# Patient Record
Sex: Female | Born: 2000 | Hispanic: No | Marital: Single | Smoking: Never smoker
Health system: Southern US, Community
[De-identification: ages and names within clinical notes are randomized; demographics above are authoritative.]

## PROBLEM LIST (undated history)

## (undated) DIAGNOSIS — Z789 Other specified health status: Secondary | ICD-10-CM

## (undated) HISTORY — PX: NO PAST SURGERIES: SHX2092

---
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
## 2019-11-21 ENCOUNTER — Other Ambulatory Visit: Payer: Self-pay

## 2019-11-21 ENCOUNTER — Ambulatory Visit (INDEPENDENT_AMBULATORY_CARE_PROVIDER_SITE_OTHER): Payer: Medicaid Other | Admitting: Dermatology

## 2019-11-21 DIAGNOSIS — L700 Acne vulgaris: Secondary | ICD-10-CM

## 2019-11-21 DIAGNOSIS — L910 Hypertrophic scar: Secondary | ICD-10-CM | POA: Diagnosis not present

## 2019-11-21 MED ORDER — DIFFERIN 0.3 % EX GEL: CUTANEOUS | 2 refills | Status: DC

## 2019-11-21 MED ORDER — CLINDAMYCIN PHOSPHATE 1 % EX LOTN: TOPICAL_LOTION | Freq: Every morning | CUTANEOUS | 2 refills | Status: DC

## 2019-11-21 NOTE — Progress Notes (Signed)
   New Patient Visit  Subjective  Kelly Ponce is a 19 y.o. female who presents for the following: Acne.  Patient here today for acne. She has had acne on the face for about 2-3 years. PCP prescribed patient a topical cream but patient does not remember what it was and advises it did not work. Patient also has 2 bumps on upper left arm. Present for about 1 year. They do occasionally itch. Patient has treated with neosporin.   The following portions of the chart were reviewed this encounter and updated as appropriate:     Review of Systems:  No other skin or systemic complaints except as noted in HPI or Assessment and Plan.  Objective  Well appearing patient in no apparent distress; mood and affect are within normal limits.  A focused examination was performed including face, left arm. Relevant physical exam findings are noted in the Assessment and Plan.  Objective  Face: Cheeks with resolving inflammatory papules, hyperpigmented macules. Inflammatory papule on forehead.   Objective  Left Shoulder - Posterior (2): Firm pink/brown papulenodule  x 2   Assessment & Plan  Acne vulgaris Face  Start Differin 0.3% gel to cheeks and forehead QOHS-QHS as tolerated. Use pea size amount and rub in evenly. May apply a light moisturizer first.  Recommend a gentle cleanser.  Start clindamycin lotion QAM.   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 Informed consent was obtained. Preparation: The area was prepared a standard  fashion.  Anesthesia:none  Procedure Details: An intralesional injection was performed with Kenalog 10 mg/cc. 0.4 cc in total were injected.  Total number of injections: < 7  Plan: The patient was instructed on post-op care. Recommend OTC analgesia as needed for pain.   Return in 3 months (on 02/21/2020) for ACNE.   Anise Salvo, RMA, am acting as scribe for Willeen Niece, MD .   Documentation: I have reviewed the above documentation for accuracy and completeness, and I agree with the above.  Willeen Niece, MD

## 2019-11-21 NOTE — Patient Instructions (Signed)
Topical retinoid medications like tretinoin/Retin-A, adapalene/Differin, tazarotene/Fabior, and Epiduo/Epiduo Forte can cause dryness and irritation when first started. Only apply a pea-sized amount to the entire affected area. Avoid applying it around the eyes, edges of mouth and creases at the nose. If you experience irritation, use a good moisturizer first and/or apply the medicine less often. If you are doing well with the medicine, you can increase how often you use it until you are applying every night. Be careful with sun protection while using this medication as it can make you sensitive to the sun. This medicine should not be used by pregnant women.   Recommend gentle cleanser.

## 2019-11-30 ENCOUNTER — Other Ambulatory Visit: Payer: Self-pay

## 2019-11-30 MED ORDER — CLINDAMYCIN PHOSPHATE 1 % EX SOLN: Freq: Every morning | CUTANEOUS | 0 refills | Status: DC

## 2019-11-30 NOTE — Progress Notes (Signed)
Clindamycin Solution sent in as replacement to Clindamycin Lotion due to insurance formulary.

## 2019-12-06 ENCOUNTER — Other Ambulatory Visit: Payer: Self-pay | Admitting: Dermatology

## 2020-03-19 ENCOUNTER — Ambulatory Visit (INDEPENDENT_AMBULATORY_CARE_PROVIDER_SITE_OTHER): Payer: Medicaid Other | Admitting: Dermatology

## 2020-03-19 ENCOUNTER — Other Ambulatory Visit: Payer: Self-pay

## 2020-03-19 DIAGNOSIS — L700 Acne vulgaris: Secondary | ICD-10-CM

## 2020-03-19 DIAGNOSIS — L910 Hypertrophic scar: Secondary | ICD-10-CM | POA: Diagnosis not present

## 2020-03-19 MED ORDER — CLINDAMYCIN PHOSPHATE 1 % EX LOTN: TOPICAL_LOTION | Freq: Every morning | CUTANEOUS | 2 refills | Status: DC

## 2020-03-19 MED ORDER — DIFFERIN 0.3 % EX GEL: CUTANEOUS | 2 refills | Status: DC

## 2020-03-19 NOTE — Patient Instructions (Signed)
Topical retinoid medications like tretinoin/Retin-A, adapalene/Differin, tazarotene/Fabior, and Epiduo/Epiduo Forte can cause dryness and irritation when first started. Only apply a pea-sized amount to the entire affected area. Avoid applying it around the eyes, edges of mouth and creases at the nose. If you experience irritation, use a good moisturizer first and/or apply the medicine less often. If you are doing well with the medicine, you can increase how often you use it until you are applying every night. Be careful with sun protection while using this medication as it can make you sensitive to the sun. This medicine should not be used by pregnant women.   Recommend daily broad spectrum sunscreen SPF 30+ to sun-exposed areas, reapply every 2 hours as needed. Call for new or changing lesions.

## 2020-03-19 NOTE — Progress Notes (Signed)
   Follow-Up Visit   Subjective  Kelly Ponce is a 19 y.o. female who presents for the following: Follow-up.  Patient here today for 3 month acne follow up. She is using Differin 0.3% gel every night and clindamycin lotion in the mornings. She advises her acne has improved. She would also like IL injection to hypertrophic scar at left shoulder. Patient had injection at last visit and it did help flatten out larger scar.  The following portions of the chart were reviewed this encounter and updated as appropriate:      Review of Systems:  No other skin or systemic complaints except as noted in HPI or Assessment and Plan.  Objective  Well appearing patient in no apparent distress; mood and affect are within normal limits.  A focused examination was performed including face, neck, chest and back. Relevant physical exam findings are noted in the Assessment and Plan.  Objective  face: Hyperpigmented macules on cheeks and chin, Inflamed comedone on left cheek  Objective  Left Shoulder: Superior scar with flattening with mild induration superior edge 1.2cm, Firm flesh nodule 60mm inferior   Assessment & Plan  Acne vulgaris face  Improved with PIH  Cont Differin 0.3% gel nightly Cont clindamycin lotion in the mornings Sunscreen qam  DIFFERIN 0.3 % gel - face  Ordered Medications: DIFFERIN 0.3 % gel  Reordered Medications clindamycin (CLEOCIN-T) 1 % lotion  Hypertrophic scar Left Shoulder  Improving with IL steroid injections  Intralesional injection - Left Shoulder Location: left posterior shoulder  Informed Consent: Discussed risks (infection, pain, bleeding, bruising, thinning of the skin, loss of skin pigment, lack of resolution, and recurrence of lesion) and benefits of the procedure, as well as the alternatives. Informed consent was obtained. Preparation: The area was prepared a standard fashion.  Anesthesia:none  Procedure Details: An intralesional injection  was performed with Kenalog 10 mg/cc. 0.2 cc in total were injected.  Total number of injections: 4  Plan: The patient was instructed on post-op care. Recommend OTC analgesia as needed for pain.   Return in about 6 months (around 09/16/2020) for Acne.  Anise Salvo, RMA, am acting as scribe for Willeen Niece, MD . Documentation: I have reviewed the above documentation for accuracy and completeness, and I agree with the above.  Willeen Niece MD

## 2020-09-10 ENCOUNTER — Other Ambulatory Visit: Payer: Self-pay

## 2020-09-10 ENCOUNTER — Ambulatory Visit (INDEPENDENT_AMBULATORY_CARE_PROVIDER_SITE_OTHER): Payer: Medicaid Other | Admitting: Dermatology

## 2020-09-10 DIAGNOSIS — L910 Hypertrophic scar: Secondary | ICD-10-CM | POA: Diagnosis not present

## 2020-09-10 DIAGNOSIS — L700 Acne vulgaris: Secondary | ICD-10-CM | POA: Diagnosis not present

## 2020-09-10 MED ORDER — DIFFERIN 0.3 % EX GEL: CUTANEOUS | 5 refills | Status: DC

## 2020-09-10 MED ORDER — CLINDAMYCIN PHOSPHATE 1 % EX LOTN: TOPICAL_LOTION | Freq: Every morning | CUTANEOUS | 5 refills | Status: AC

## 2020-09-10 NOTE — Progress Notes (Signed)
   Follow-Up Visit   Subjective  Kelly Ponce is a 20 y.o. female who presents for the following: Acne (Patient here today for acne follow up. She is using Differin 0.3% gel at night and clindamycin lotion in the am. Patient advises acne has improved. ).  She has a thickened scar on her shoulder that was treated last visit with ILK.  It improved then gradually grew back.  The following portions of the chart were reviewed this encounter and updated as appropriate:       Review of Systems:  No other skin or systemic complaints except as noted in HPI or Assessment and Plan.  Objective  Well appearing patient in no apparent distress; mood and affect are within normal limits.  A focused examination was performed including face, neck, chest and back. Relevant physical exam findings are noted in the Assessment and Plan.  Objective  face: Few inflamed comedones at right cheek, hyperpigmented macules at forehead and cheeks  Objective  Left Shoulder - Posterior: Firm pink brown plaque 1.5cm   Assessment & Plan  Acne vulgaris face  Controlled Continue Differin 0.3% gel QHS as tolerated Continue clindamycin lotion QAM  Topical retinoid medications like tretinoin/Retin-A, adapalene/Differin, tazarotene/Fabior, and Epiduo/Epiduo Forte can cause dryness and irritation when first started. Only apply a pea-sized amount to the entire affected area. Avoid applying it around the eyes, edges of mouth and creases at the nose. If you experience irritation, use a good moisturizer first and/or apply the medicine less often. If you are doing well with the medicine, you can increase how often you use it until you are applying every night. Be careful with sun protection while using this medication as it can make you sensitive to the sun. This medicine should not be used by pregnant women.    Reordered Medications DIFFERIN 0.3 % gel clindamycin (CLEOCIN-T) 1 % lotion  Other Related  Medications DIFFERIN 0.3 % gel  Hypertrophic scar Left Shoulder - Posterior  Intralesional steroid injection side effects were reviewed including thinning of the skin and discoloration, such as redness, lightening or darkening.     Intralesional injection - Left Shoulder - Posterior Location: left posterior shoulder  Informed Consent: Discussed risks (infection, pain, bleeding, bruising, thinning of the skin, loss of skin pigment, lack of resolution, and recurrence of lesion) and benefits of the procedure, as well as the alternatives. Informed consent was obtained. Preparation: The area was prepared a standard fashion.  Anesthesia:none  Procedure Details: An intralesional injection was performed with Kenalog 10 mg/cc. 0.2 cc in total were injected.  Total number of injections: < 7  Plan: The patient was instructed on post-op care. Recommend OTC analgesia as needed for pain.   Return in about 6 months (around 03/10/2021) for Acne, Hyp.Scar.  Anise Salvo, RMA, am acting as scribe for Willeen Niece, MD . Documentation: I have reviewed the above documentation for accuracy and completeness, and I agree with the above.  Willeen Niece MD

## 2020-09-10 NOTE — Patient Instructions (Signed)

## 2021-03-11 ENCOUNTER — Ambulatory Visit: Payer: Medicaid Other | Admitting: Dermatology

## 2021-04-09 ENCOUNTER — Ambulatory Visit: Payer: Medicaid Other | Admitting: Dermatology

## 2021-07-17 ENCOUNTER — Encounter: Payer: Medicaid Other | Admitting: Obstetrics and Gynecology

## 2021-09-11 ENCOUNTER — Emergency Department: Payer: Medicaid Other

## 2021-09-11 ENCOUNTER — Other Ambulatory Visit: Payer: Self-pay

## 2021-09-11 ENCOUNTER — Observation Stay
Admission: EM | Admit: 2021-09-11 | Discharge: 2021-09-12 | Disposition: A | Discharge status: Home / Self Care | Payer: Medicaid Other | Attending: Internal Medicine | Admitting: Internal Medicine

## 2021-09-11 DIAGNOSIS — R0602 Shortness of breath: Secondary | ICD-10-CM | POA: Diagnosis present

## 2021-09-11 DIAGNOSIS — Z20822 Contact with and (suspected) exposure to covid-19: Secondary | ICD-10-CM | POA: Insufficient documentation

## 2021-09-11 DIAGNOSIS — J45901 Unspecified asthma with (acute) exacerbation: Secondary | ICD-10-CM

## 2021-09-11 DIAGNOSIS — J209 Acute bronchitis, unspecified: Principal | ICD-10-CM | POA: Diagnosis present

## 2021-09-11 DIAGNOSIS — R079 Chest pain, unspecified: Secondary | ICD-10-CM | POA: Diagnosis present

## 2021-09-11 DIAGNOSIS — J40 Bronchitis, not specified as acute or chronic: Secondary | ICD-10-CM

## 2021-09-11 LAB — CBC
HCT: 45.1 % (ref 36.0–46.0)
Hemoglobin: 15.4 g/dL — ABNORMAL HIGH (ref 12.0–15.0)
MCH: 31.3 pg (ref 26.0–34.0)
MCHC: 34.1 g/dL (ref 30.0–36.0)
MCV: 91.7 fL (ref 80.0–100.0)
Platelets: 261 10*3/uL (ref 150–400)
RBC: 4.92 MIL/uL (ref 3.87–5.11)
RDW: 12.7 % (ref 11.5–15.5)
WBC: 10.5 10*3/uL (ref 4.0–10.5)
nRBC: 0 % (ref 0.0–0.2)

## 2021-09-11 LAB — BASIC METABOLIC PANEL
Anion gap: 9 (ref 5–15)
BUN: 10 mg/dL (ref 6–20)
CO2: 25 mmol/L (ref 22–32)
Calcium: 9.4 mg/dL (ref 8.9–10.3)
Chloride: 104 mmol/L (ref 98–111)
Creatinine, Ser: 0.73 mg/dL (ref 0.44–1.00)
GFR, Estimated: >60 mL/min (ref >60)
Glucose, Bld: 88 mg/dL (ref 70–99)
Potassium: 3.7 mmol/L (ref 3.5–5.1)
Sodium: 138 mmol/L (ref 135–145)

## 2021-09-11 LAB — RESP PANEL BY RT-PCR (FLU A&B, COVID) ARPGX2
Influenza A by PCR: NEGATIVE
Influenza B by PCR: NEGATIVE
SARS Coronavirus 2 by RT PCR: NEGATIVE

## 2021-09-11 LAB — TROPONIN I (HIGH SENSITIVITY)
Troponin I (High Sensitivity): <2 ng/L (ref <18)
Troponin I (High Sensitivity): <2 ng/L (ref <18)

## 2021-09-11 LAB — POC URINE PREG, ED: Preg Test, Ur: NEGATIVE

## 2021-09-11 LAB — MAGNESIUM: Magnesium: 1.6 mg/dL — ABNORMAL LOW (ref 1.7–2.4)

## 2021-09-11 MED ORDER — LEVALBUTEROL HCL 1.25 MG/0.5ML IN NEBU
1.2500 mg | INHALATION_SOLUTION | Freq: Four times a day (QID) | RESPIRATORY_TRACT | Status: DC
  Administered 2021-09-12 (×2): 1.25 mg via RESPIRATORY_TRACT
  Filled 2021-09-11 (×3): qty 0.5

## 2021-09-11 MED ORDER — IPRATROPIUM-ALBUTEROL 0.5-2.5 (3) MG/3ML IN SOLN
3.0000 mL | Freq: Once | RESPIRATORY_TRACT | Status: AC
Start: 2021-09-11 — End: 2021-09-11
  Administered 2021-09-11: 3 mL via RESPIRATORY_TRACT
  Filled 2021-09-11: qty 3

## 2021-09-11 MED ORDER — MAGNESIUM SULFATE 2 GM/50ML IV SOLN
2.0000 g | Freq: Once | INTRAVENOUS | Status: AC
Start: 2021-09-11 — End: 2021-09-12
  Administered 2021-09-12: 2 g via INTRAVENOUS
  Filled 2021-09-11: qty 50

## 2021-09-11 MED ORDER — ACETAMINOPHEN 325 MG PO TABS: 650.0000 mg | ORAL_TABLET | Freq: Four times a day (QID) | ORAL | Status: DC | PRN

## 2021-09-11 MED ORDER — ACETAMINOPHEN 650 MG RE SUPP: 650.0000 mg | Freq: Four times a day (QID) | RECTAL | Status: DC | PRN

## 2021-09-11 MED ORDER — IPRATROPIUM-ALBUTEROL 0.5-2.5 (3) MG/3ML IN SOLN
3.0000 mL | Freq: Once | RESPIRATORY_TRACT | Status: AC
  Administered 2021-09-11: 3 mL via RESPIRATORY_TRACT
  Filled 2021-09-11: qty 3

## 2021-09-11 MED ORDER — IPRATROPIUM BROMIDE 0.02 % IN SOLN
0.5000 mg | Freq: Four times a day (QID) | RESPIRATORY_TRACT | Status: DC
  Administered 2021-09-11 – 2021-09-12 (×2): 0.5 mg via RESPIRATORY_TRACT
  Filled 2021-09-11 (×2): qty 2.5

## 2021-09-11 MED ORDER — AZITHROMYCIN 500 MG PO TABS
500.0000 mg | ORAL_TABLET | Freq: Once | ORAL | Status: AC
Start: 2021-09-11 — End: 2021-09-11
  Administered 2021-09-11: 500 mg via ORAL
  Filled 2021-09-11: qty 1

## 2021-09-11 MED ORDER — SODIUM CHLORIDE 0.9 % IV SOLN
500.0000 mg | INTRAVENOUS | Status: DC
  Administered 2021-09-12: 500 mg via INTRAVENOUS
  Filled 2021-09-11: qty 5

## 2021-09-11 MED ORDER — PREDNISONE 20 MG PO TABS
60.0000 mg | ORAL_TABLET | Freq: Once | ORAL | Status: AC
  Administered 2021-09-11: 60 mg via ORAL
  Filled 2021-09-11: qty 3

## 2021-09-11 MED ORDER — PREDNISONE 20 MG PO TABS
40.0000 mg | ORAL_TABLET | Freq: Every day | ORAL | Status: DC
Start: 2021-09-12 — End: 2021-09-12
  Administered 2021-09-12: 40 mg via ORAL
  Filled 2021-09-11: qty 2

## 2021-09-11 MED ORDER — LEVALBUTEROL HCL 1.25 MG/0.5ML IN NEBU
1.2500 mg | INHALATION_SOLUTION | RESPIRATORY_TRACT | Status: DC | PRN
Start: 2021-09-11 — End: 2021-09-12

## 2021-09-11 NOTE — ED Notes (Signed)
20 yof c/o sob and chest pain since Sunday. The pt has constant productive cough with brown sputum. The pt does have some midsternal chest pain especially while coughing. No Resp. Hx.

## 2021-09-11 NOTE — Progress Notes (Signed)
Peak flow best of 5 with good effort 270 mL.

## 2021-09-11 NOTE — ED Provider Notes (Signed)
[facility] Provider Note    Event Date/Time   First MD Initiated Contact with Patient 09/11/21 1735     (approximate)   History   Chest Pain   HPI  Kelly Ponce is a 21 y.o. female who complains of shortness of breath and some pain in her chest when she coughs and brown sputum coming up when she coughs.  She has no fever and some shortness of breath.  COVID test was negative at home.      Physical Exam   Triage Vital Signs: ED Triage Vitals  Enc Vitals Group     BP 09/11/21 1649 117/82     Pulse Rate 09/11/21 1649 98     Resp 09/11/21 1649 20     Temp 09/11/21 1649 99.1 F (37.3 C)     Temp Source 09/11/21 1649 Oral     SpO2 09/11/21 1649 96 %     Weight 09/11/21 1648 140 lb (63.5 kg)     Height 09/11/21 1648 5\' 3"  (1.6 m)     Head Circumference --      Peak Flow --      Pain Score 09/11/21 1648 8     Pain Loc --      Pain Edu? --      Excl. in GC? --     Most recent vital signs: Vitals:   09/11/21 1649 09/11/21 1957  BP: 117/82 106/64  Pulse: 98 (!) 122  Resp: 20 20  Temp: 99.1 F (37.3 C)   SpO2: 96% 95%     General: Awake, no distress.  CV:  Good peripheral perfusion.  Heart regular rate and rhythm no audible murmurs Resp:  Normal effort.  Lungs with some diffuse wheezes Abd:  No distention.  Nontender Extremities: No edema   ED Results / Procedures / Treatments   Labs (all labs ordered are listed, but only abnormal results are displayed) Labs Reviewed  CBC - Abnormal; Notable for the following components:      Result Value   Hemoglobin 15.4 (*)    All other components within normal limits  RESP PANEL BY RT-PCR (FLU A&B, COVID) [id number]  BASIC METABOLIC PANEL  POC URINE PREG, ED  TROPONIN I (HIGH SENSITIVITY)  TROPONIN I (HIGH SENSITIVITY)     EKG  EKG read interpreted by me shows normal sinus rhythm rate of 93 rightward axis slightly 95 degrees.  No acute disease   RADIOLOGY Chest x-ray read by radiology  reviewed by me including the pictures does not show any acute disease   PROCEDURES:  Critical Care performed:   Procedures   MEDICATIONS ORDERED IN ED: Medications  ipratropium-albuterol (DUONEB) 0.5-2.5 (3) MG/3ML nebulizer solution 3 mL (3 mLs Nebulization Given 09/11/21 1751)  ipratropium-albuterol (DUONEB) 0.5-2.5 (3) MG/3ML nebulizer solution 3 mL (3 mLs Nebulization Given 09/11/21 1849)  ipratropium-albuterol (DUONEB) 0.5-2.5 (3) MG/3ML nebulizer solution 3 mL (3 mLs Nebulization Given 09/11/21 2024)  predniSONE (DELTASONE) tablet 60 mg (60 mg Oral Given 09/11/21 2024)  azithromycin (ZITHROMAX) tablet 500 mg (500 mg Oral Given 09/11/21 2024)     IMPRESSION / MDM / ASSESSMENT AND PLAN / ED COURSE  I reviewed the triage vital signs and the nursing notes. ----------------------------------------- 8:14 PM on 09/11/2021 ----------------------------------------- Patient feeling better after 3 DuoNebs and steroids and Zithromax however still with some wheezing and peak flow does not make 300.  Infected just touches to 79 decreases with further attempts.  FINAL CLINICAL IMPRESSION(S) / ED DIAGNOSES   Final diagnoses:  Bronchitis  Reactive airway disease with acute exacerbation, unspecified asthma severity, unspecified whether persistent     Rx / DC Orders   ED Discharge Orders     None        Note:  This document was prepared using Dragon voice recognition software and may include unintentional dictation errors.   Arnaldo Natal, MD 09/11/21 2034

## 2021-09-11 NOTE — ED Triage Notes (Signed)
Patient to ER via POV with complaints of centralized, non-radiating chest pain that started three days ago. Also complaints of cough and congestion. Some shortness of breath. Denies fevers at home. Negative covid test at home.

## 2021-09-12 ENCOUNTER — Encounter: Payer: Self-pay | Admitting: Internal Medicine

## 2021-09-12 DIAGNOSIS — R0602 Shortness of breath: Secondary | ICD-10-CM | POA: Diagnosis present

## 2021-09-12 DIAGNOSIS — J209 Acute bronchitis, unspecified: Secondary | ICD-10-CM | POA: Diagnosis not present

## 2021-09-12 LAB — CBC WITH DIFFERENTIAL/PLATELET
Abs Immature Granulocytes: 0.03 10*3/uL (ref 0.00–0.07)
Basophils Absolute: 0 10*3/uL (ref 0.0–0.1)
Basophils Relative: 0 %
Eosinophils Absolute: 0 10*3/uL (ref 0.0–0.5)
Eosinophils Relative: 0 %
HCT: 40.9 % (ref 36.0–46.0)
Hemoglobin: 13.9 g/dL (ref 12.0–15.0)
Immature Granulocytes: 0 %
Lymphocytes Relative: 5 %
Lymphs Abs: 0.5 10*3/uL — ABNORMAL LOW (ref 0.7–4.0)
MCH: 31.3 pg (ref 26.0–34.0)
MCHC: 34 g/dL (ref 30.0–36.0)
MCV: 92.1 fL (ref 80.0–100.0)
Monocytes Absolute: 0.1 10*3/uL (ref 0.1–1.0)
Monocytes Relative: 1 %
Neutro Abs: 8.1 10*3/uL — ABNORMAL HIGH (ref 1.7–7.7)
Neutrophils Relative %: 94 %
Platelets: 263 10*3/uL (ref 150–400)
RBC: 4.44 MIL/uL (ref 3.87–5.11)
RDW: 12.7 % (ref 11.5–15.5)
WBC: 8.6 10*3/uL (ref 4.0–10.5)
nRBC: 0 % (ref 0.0–0.2)

## 2021-09-12 LAB — COMPREHENSIVE METABOLIC PANEL
ALT: 10 U/L (ref 0–44)
AST: 29 U/L (ref 15–41)
Albumin: 4.1 g/dL (ref 3.5–5.0)
Alkaline Phosphatase: 68 U/L (ref 38–126)
Anion gap: 14 (ref 5–15)
BUN: 11 mg/dL (ref 6–20)
CO2: 18 mmol/L — ABNORMAL LOW (ref 22–32)
Calcium: 9.3 mg/dL (ref 8.9–10.3)
Chloride: 104 mmol/L (ref 98–111)
Creatinine, Ser: 0.72 mg/dL (ref 0.44–1.00)
GFR, Estimated: >60 mL/min (ref >60)
Glucose, Bld: 202 mg/dL — ABNORMAL HIGH (ref 70–99)
Potassium: 3.6 mmol/L (ref 3.5–5.1)
Sodium: 136 mmol/L (ref 135–145)
Total Bilirubin: 0.4 mg/dL (ref 0.3–1.2)
Total Protein: 7.9 g/dL (ref 6.5–8.1)

## 2021-09-12 LAB — HIV ANTIBODY (ROUTINE TESTING W REFLEX): HIV Screen 4th Generation wRfx: NONREACTIVE

## 2021-09-12 LAB — PHOSPHORUS: Phosphorus: 3.2 mg/dL (ref 2.5–4.6)

## 2021-09-12 LAB — PROCALCITONIN: Procalcitonin: <0.1 ng/mL

## 2021-09-12 LAB — MAGNESIUM: Magnesium: 2.3 mg/dL (ref 1.7–2.4)

## 2021-09-12 MED ORDER — PREDNISONE 20 MG PO TABS: 40.0000 mg | ORAL_TABLET | Freq: Every day | ORAL | 0 refills | Status: AC

## 2021-09-12 MED ORDER — LORAZEPAM 0.5 MG PO TABS
0.5000 mg | ORAL_TABLET | Freq: Four times a day (QID) | ORAL | Status: DC | PRN
  Administered 2021-09-12: 0.5 mg via ORAL
  Filled 2021-09-12: qty 1

## 2021-09-12 MED ORDER — ALBUTEROL SULFATE HFA 108 (90 BASE) MCG/ACT IN AERS: 1.0000 | INHALATION_SPRAY | Freq: Four times a day (QID) | RESPIRATORY_TRACT | 0 refills | Status: DC | PRN

## 2021-09-12 MED ORDER — AZITHROMYCIN 500 MG PO TABS: 500.0000 mg | ORAL_TABLET | Freq: Every day | ORAL | 0 refills | Status: AC

## 2021-09-12 NOTE — H&P (Signed)
History and Physical    PLEASE NOTE THAT DRAGON DICTATION SOFTWARE WAS USED IN THE CONSTRUCTION OF THIS NOTE.   Kelly Ponce [id number] DOB: [birth year] DOA: 09/11/2021  PCP: Danelle Berry, NP  Patient coming from: home   I have personally briefly reviewed patient's old medical records in [place]  Chief Complaint: Shortness of breath  HPI: Kelly Ponce is a 21 y.o. female who denies any significant past medical history,  who is admitted to [facility] on 09/11/2021 with suspected acute bronchitis with bronchospasm after presenting from home to [facility] ED complaining of shortness of breath.   The patient reports 2 to 3 days of progressive shortness of breath associated with new onset productive cough associated with brown-colored sputum.  She also notes associated intermittent wheezing as well as rhinitis, rhinorrhea.  While she notes generalized myalgias, she denies any subjective fever, chills, or full body rigors.  No recent headache, neck stiffness, nausea, vomiting, abdominal pain, diarrhea, or rash.  Denies any recent dysuria, gross hematuria, or change in urinary urgency/frequency.  Not associate with any recent chest pain, diaphoresis, or palpitations.  Her shortness of breath is not associate with any orthopnea, PND, or new onset peripheral edema.  No hemoptysis, new lower extremity erythema, or calf tenderness.Denies any recent trauma, travel, surgical procedures, or periods of prolonged diminished ambulatory status. No recent melena or hematochezia.   Denies any known history of underlying chronic pulmonary pathology, including no known history of asthma.  Confirms no baseline supplemental oxygen requirements.     ED Course:  Vital signs in the ED were notable for the following: Temperature max 99.1; heart rate 98-1 09; blood pressure 117/82; respiratory rate 19-20, oxygen saturation 96 to 98% on room air.  Labs were notable for the following: CBC notable for white blood  cell count 10,500, hemoglobin 15.4.  High-sensitivity troponin I x2 values were both less than 2.  COVID-19/influenza PCR negative.  Imaging and additional notable ED work-up: EKG shows sinus rhythm with heart rate 93, normal intervals, no evidence of T wave or ST changes.  Chest x-ray shows no evidence of acute cardiopulmonary process, including no evidence of infiltrate.  While in the ED, the following were administered: Duo nebulizer treatments x2, azithromycin prednisone 60 mg p.o. x1 dose.  Subsequently, the patient was admitted for suspected acute bronchitis with bronchospasm.      Review of Systems: As per HPI otherwise 10 point review of systems negative.   History reviewed. No pertinent past medical history.  History reviewed. No pertinent surgical history.  Social History:  reports that she has never smoked. She has never used smokeless tobacco. She reports that she does not drink alcohol and does not use drugs.   No Known Allergies  History reviewed. No pertinent family history.  Family history reviewed and not pertinent    Prior to Admission medications   Medication Sig Start Date End Date Taking? Authorizing Provider  DIFFERIN 0.3 % gel Apply thin layer to face nightly as directed 03/19/20   Brendolyn Patty, MD  DIFFERIN 0.3 % gel Apply topically to face at bedtime as directed. 09/10/20   Brendolyn Patty, MD  FLUoxetine (PROZAC) 20 MG capsule Take 20 mg by mouth every morning. Patient not taking: Reported on 09/10/2020 02/26/20   [provider]  LO LOESTRIN FE 1 MG-10 MCG / 10 MCG tablet Take 1 tablet by mouth daily. 02/26/20   [provider]     Objective    Physical  Exam: Vitals:   09/11/21 1648 09/11/21 1649 09/11/21 1957 09/12/21 0028  BP:  117/82 106/64 (!) 101/58  Pulse:  98 (!) 122 (!) 116  Resp:  20 20 19   Temp:  99.1 F (37.3 C)    TempSrc:  Oral    SpO2:  96% 95% 98%  Weight: 63.5 kg     Height: 5\' 3"  (1.6 m)       General:  appears to be stated age; alert, oriented Skin: warm, dry, no rash Head:  AT/[place] Mouth:  Oral mucosa membranes appear moist, normal dentition Neck: supple; trachea midline Heart:  RRR; did not appreciate any M/R/G Lungs: Mild bilateral expiratory wheezes, but otherwise CTAB, did not appreciate any rales, or rhonchi Abdomen: + BS; soft, ND, NT Vascular: 2+ pedal pulses b/l; 2+ radial pulses b/l Extremities: no peripheral edema, no muscle wasting Neuro: strength and sensation intact in upper and lower extremities b/l     Labs on Admission: I have personally reviewed following labs and imaging studies  CBC: Recent Labs  Lab 09/11/21 1650  WBC 10.5  HGB 15.4*  HCT 45.1  MCV 91.7  PLT 0000000   Basic Metabolic Panel: Recent Labs  Lab 09/11/21 1650 09/11/21 2054  NA 138  --   K 3.7  --   CL 104  --   CO2 25  --   GLUCOSE 88  --   BUN 10  --   CREATININE 0.73  --   CALCIUM 9.4  --   MG  --  1.6*   GFR: Estimated Creatinine Clearance: 99.7 mL/min (by C-G formula based on SCr of 0.73 mg/dL). Liver Function Tests: No results for input(s): AST, ALT, ALKPHOS, BILITOT, PROT, ALBUMIN in the last 168 hours. No results for input(s): LIPASE, AMYLASE in the last 168 hours. No results for input(s): AMMONIA in the last 168 hours. Coagulation Profile: No results for input(s): INR, PROTIME in the last 168 hours. Cardiac Enzymes: No results for input(s): CKTOTAL, CKMB, CKMBINDEX, TROPONINI in the last 168 hours. BNP (last 3 results) No results for input(s): PROBNP in the last 8760 hours. HbA1C: No results for input(s): HGBA1C in the last 72 hours. CBG: No results for input(s): GLUCAP in the last 168 hours. Lipid Profile: No results for input(s): CHOL, HDL, LDLCALC, TRIG, CHOLHDL, LDLDIRECT in the last 72 hours. Thyroid Function Tests: No results for input(s): TSH, T4TOTAL, FREET4, T3FREE, THYROIDAB in the last 72 hours. Anemia Panel: No results for input(s): VITAMINB12, FOLATE,  FERRITIN, TIBC, IRON, RETICCTPCT in the last 72 hours. Urine analysis:    Component Value Date/Time   COLORURINE YELLOW 06/17/2016 2128   APPEARANCEUR HAZY (A) 06/17/2016 2128   LABSPEC 1.020 06/17/2016 2128   PHURINE 6.0 06/17/2016 2128   GLUCOSEU NEGATIVE 06/17/2016 2128   HGBUR NEGATIVE 06/17/2016 2128   BILIRUBINUR NEGATIVE 06/17/2016 2128   KETONESUR NEGATIVE 06/17/2016 2128   PROTEINUR NEGATIVE 06/17/2016 2128   NITRITE NEGATIVE 06/17/2016 2128   LEUKOCYTESUR NEGATIVE 06/17/2016 2128    Radiological Exams on Admission: DG Chest 2 View  Result Date: 09/11/2021 CLINICAL DATA:  Chest pain, centralized nonradiating EXAM: CHEST - 2 VIEW COMPARISON:  None. FINDINGS: The heart size and mediastinal contours are within normal limits. No focal consolidation. No pleural effusion. No pneumothorax. The visualized skeletal structures are unremarkable. IMPRESSION: No acute cardiopulmonary findings. Electronically Signed   By: Dahlia Bailiff M.D.   On: 09/11/2021 17:08     EKG: Independently reviewed, with result as described above.  Assessment/Plan    Principal Problem:   Acute bronchitis with bronchospasm Active Problems:   Hypomagnesemia   SOB (shortness of breath)    #) Acute bronchitis with bronchospasm: Diagnosis on the basis of 2 to 3 days of progressive shortness of breath, new onset cough, suspected to be viral in nature in the setting of preceding rhinitis/rhinorrhea, as well as associated bronchospasm as evidenced by the presence of wheezing, with chest x-ray showing no evidence of acute cardiopulmonary process, including no evidence of infiltrate.  Will further assess for underlying bacterial pneumonia, which is felt to be less likely at this time, but check procalcitonin.  No underlying history of chronic pulmonary pathology, including no known history of asthma.  Maintaining oxygen saturations in the mid to high 90s on room air.  In the context of mild tachycardia, will  pursue Xopenex as beta-2 agonist of choice at this time.  Of note, COVID-19/influenza PCR negative.  Plan: Xopenex/ipratropium nebulizer treatments every 6 hours.  As needed Xopenex nebulizers.  Prednisone 40 mg p.o. daily.  Azithromycin.  Further evaluation management of presenting hypomagnesemia, as further detailed below.  Add on procalcitonin level.  Repeat CBC with differential in the morning.  Flutter valve, NSAIDs [place].       #) Hypomagnesemia: Presenting serum magnesium level found to be 1.6.  Will provide supplemental IV magnesium, as quantified below.   Plan: Magnesium sulfate 2 g IV every 2 hours x1 dose now.  Repeat serum magnesium level in the morning.      DVT prophylaxis: SCD's   Code Status: Full code Family Communication: none Disposition Plan: Per Rounding Team Consults called: none;  Admission status: Inpatient; med telemetry   PLEASE NOTE THAT DRAGON DICTATION SOFTWARE WAS USED IN THE CONSTRUCTION OF THIS NOTE.   Taylor DO [facility] [place]   09/12/2021, 3:28 AM

## 2021-09-12 NOTE — Discharge Summary (Signed)
Physician Discharge Summary  Kelly Ponce [id number] DOB: 2001-07-20 DOA: 09/11/2021  PCP: Danelle Berry, NP  Admit date: 09/11/2021 Discharge date: 09/12/2021  Admitted From: Home  Disposition: Home   Recommendations for Outpatient Follow-up:  Follow up with PCP in 1-2 weeks Started on azithromycin and prednisone for 5-day course for bronchitis Albuterol MDI as needed  Home Health: No Equipment/Devices: None  Discharge Condition: Stable CODE STATUS: Full code Diet recommendation: Regular diet  History of present illness:  Kelly Ponce is a 21 year old female with no significant past medical history who presented to [facility] ED on 2/23 with 2-3 days of progressive shortness of breath.  Patient also reports productive cough with brown-colored sputum, wheezing, rhinitis, rhinorrhea, generalized myalgias.  Denies sick contacts, no recent medication use.  Denies headache, no neck stiffness, no nausea/vomiting/diarrhea, no abdominal pain, no rash, no dysuria, no hematuria, no chest pain, no diaphoresis, no palpitations, no peripheral edema.  In the ED, temperature 99.1 F, HR 98, RR 20, BP 117/82, SPO2 96% on room air.  WBC 10.5, hemoglobin 15.4, platelets 261.  Sodium 138, potassium 3.7, chloride 104, CO2 25, glucose 88, BUN 10, creatinine 0.73.  High sensitive troponin less than 2.  Procalcitonin less than 0.10.  COVID-19 PCR negative peer influenza A/B PCR negative.  Chest x-ray with no acute cardiopulmonary disease process.  Patient was given DuoNeb x2, started on azithromycin and prednisone.  Hospitalist service consulted for further management of acute bronchitis with bronchospasm.     Hospital course:  Acute bronchitis, POA Patient presenting with progressive shortness of breath, productive cough over the last 2-3 days.  Patient is afebrile without leukocytosis, chest x-ray with no acute findings.  Etiology likely secondary to acute bronchitis.  Patient is not hypoxic.   Patient was started on antibiotics with azithromycin we will continue to complete a 5-day course, will also continue prednisone 40 mg p.o. daily to complete 5-day course.  Albuterol MDI as needed.  Outpatient follow-up with PCP.  Hypomagnesemia Magnesium 1.6 on admission, repleted.  Repeat magnesium 2.3.   Assessment and Plan: Discharge Diagnoses:  Principal Problem:   Acute bronchitis with bronchospasm Active Problems:   Hypomagnesemia   SOB (shortness of breath)    Discharge Instructions  Discharge Instructions     Call MD for:  difficulty breathing, headache or visual disturbances   Complete by: As directed    Call MD for:  extreme fatigue   Complete by: As directed    Call MD for:  persistant dizziness or light-headedness   Complete by: As directed    Call MD for:  persistant nausea and vomiting   Complete by: As directed    Call MD for:  severe uncontrolled pain   Complete by: As directed    Call MD for:  temperature >100.4   Complete by: As directed    Diet - low sodium heart healthy   Complete by: As directed    Increase activity slowly   Complete by: As directed       Allergies as of 09/12/2021   No Known Allergies      Medication List     STOP taking these medications    FLUoxetine 20 MG capsule Commonly known as: PROZAC       TAKE these medications    albuterol 108 (90 Base) MCG/ACT inhaler Commonly known as: Ventolin HFA Inhale 1-2 puffs into the lungs every 6 (six) hours as needed for wheezing or shortness of breath.   azithromycin 500  MG tablet Commonly known as: Zithromax Take 1 tablet (500 mg total) by mouth daily for 4 days. Take 1 tablet daily for 3 days. Start taking on: September 13, 2021   Differin 0.3 % gel Generic drug: Adapalene Apply thin layer to face nightly as directed   Differin 0.3 % gel Generic drug: Adapalene Apply topically to face at bedtime as directed.   Lo Loestrin Fe 1 MG-10 MCG / 10 MCG tablet Generic drug:  Norethindrone-Ethinyl Estradiol-Fe Biphas Take 1 tablet by mouth daily.   predniSONE 20 MG tablet Commonly known as: DELTASONE Take 2 tablets (40 mg total) by mouth daily with breakfast for 4 days. Start taking on: September 13, 2021        Follow-up Information     Danelle Berry, NP. Schedule an appointment as soon as possible for a visit in 1 week(s).   Specialty: Nurse Practitioner Contact information: [place] [address] [phone]                No Known Allergies  Consultations: None   Procedures/Studies: DG Chest 2 View  Result Date: 09/11/2021 CLINICAL DATA:  Chest pain, centralized nonradiating EXAM: CHEST - 2 VIEW COMPARISON:  None. FINDINGS: The heart size and mediastinal contours are within normal limits. No focal consolidation. No pleural effusion. No pneumothorax. The visualized skeletal structures are unremarkable. IMPRESSION: No acute cardiopulmonary findings. Electronically Signed   By: Dahlia Bailiff M.D.   On: 09/11/2021 17:08     Subjective: Patient seen examined bedside, resting comfortably.  Family present at bedside.  No complaints this morning and breathing much improved.  Patient states ready for discharge home.  No other questions or concerns at this time.  Denies headache, no dizziness, no chest pain, no palpitations, no shortness of breath this morning, no abdominal pain, no fever/chills/night sweats, no nausea/vomiting/diarrhea, no weakness, no fatigue, no paresthesias.  No acute events overnight per nursing staff.  Discharge Exam: Vitals:   09/12/21 0630 09/12/21 0830  BP: 96/65 106/69  Pulse: (!) 109 (!) 109  Resp: 17 (!) 21  Temp:    SpO2: 98% 97%   Vitals:   09/12/21 0028 09/12/21 0330 09/12/21 0630 09/12/21 0830  BP: (!) 101/58 94/64 96/65  106/69  Pulse: (!) 116 (!) 103 (!) 109 (!) 109  Resp: 19 18 17  (!) 21  Temp:      TempSrc:      SpO2: 98% 97% 98% 97%  Weight:      Height:        Physical  Exam: GEN: NAD, alert and oriented x 3, wd/wn HEENT: NCAT, PERRL, EOMI, sclera clear, MMM PULM: CTAB w/o wheezes/crackles, normal respiratory effort, on room air CV: RRR w/o M/G/R GI: abd soft, NTND, NABS, no R/G/M MSK: no peripheral edema, muscle strength globally intact 5/5 bilateral upper/lower extremities NEURO: CN II-XII intact, no focal deficits, sensation to light touch intact PSYCH: normal mood/affect Integumentary: dry/intact, no rashes or wounds    The results of significant diagnostics from this hospitalization (including imaging, microbiology, ancillary and laboratory) are listed below for reference.     Microbiology: Recent Results (from the past 240 hour(s))  Resp Panel by RT-PCR (Flu A&B, Covid) Nasopharyngeal Swab     Status: None   Collection Time: 09/11/21  5:52 PM   Specimen: Nasopharyngeal Swab; Nasopharyngeal(NP) swabs in vial transport medium  Result Value Ref Range Status   SARS Coronavirus 2 by RT PCR NEGATIVE NEGATIVE Final    Comment: (NOTE) SARS-CoV-2  target nucleic acids are NOT DETECTED.  The SARS-CoV-2 RNA is generally detectable in upper respiratory specimens during the acute phase of infection. The lowest concentration of SARS-CoV-2 viral copies this assay can detect is 138 copies/mL. A negative result does not preclude SARS-Cov-2 infection and should not be used as the sole basis for treatment or other patient management decisions. A negative result may occur with  improper specimen collection/handling, submission of specimen other than nasopharyngeal swab, presence of viral mutation(s) within the areas targeted by this assay, and inadequate number of viral copies(<138 copies/mL). A negative result must be combined with clinical observations, patient history, and epidemiological information. The expected result is Negative.  Fact Sheet for Patients:  EntrepreneurPulse.com.au  Fact Sheet for Healthcare Providers:   IncredibleEmployment.be  This test is no t yet approved or cleared by the [place] FDA and  has been authorized for detection and/or diagnosis of SARS-CoV-2 by FDA under an Emergency Use Authorization (EUA). This EUA will remain  in effect (meaning this test can be used) for the duration of the COVID-19 declaration under Section 564(b)(1) of the Act, 21 U.S.C.section 360bbb-3(b)(1), unless the authorization is terminated  or revoked sooner.       Influenza A by PCR NEGATIVE NEGATIVE Final   Influenza B by PCR NEGATIVE NEGATIVE Final    Comment: (NOTE) The Xpert Xpress SARS-CoV-2/FLU/RSV plus assay is intended as an aid in the diagnosis of influenza from Nasopharyngeal swab specimens and should not be used as a sole basis for treatment. Nasal washings and aspirates are unacceptable for Xpert Xpress SARS-CoV-2/FLU/RSV testing.  Fact Sheet for Patients: EntrepreneurPulse.com.au  Fact Sheet for Healthcare Providers: IncredibleEmployment.be  This test is not yet approved or cleared by the [place] FDA and has been authorized for detection and/or diagnosis of SARS-CoV-2 by FDA under an Emergency Use Authorization (EUA). This EUA will remain in effect (meaning this test can be used) for the duration of the COVID-19 declaration under Section 564(b)(1) of the Act, 21 U.S.C. section 360bbb-3(b)(1), unless the authorization is terminated or revoked.  Performed at [facility], [address] [place], [place] [address]      Labs: BNP (last 3 results) No results for input(s): BNP in the last 8760 hours. Basic Metabolic Panel: Recent Labs  Lab 09/11/21 1650 09/11/21 2054 09/12/21 0503  NA 138  --  136  K 3.7  --  3.6  CL 104  --  104  CO2 25  --  18*  GLUCOSE 88  --  202*  BUN 10  --  11  CREATININE 0.73  --  0.72  CALCIUM 9.4  --  9.3  MG  --  1.6* 2.3  PHOS  --   --  3.2   Liver Function  Tests: Recent Labs  Lab 09/12/21 0503  AST 29  ALT 10  ALKPHOS 68  BILITOT 0.4  PROT 7.9  ALBUMIN 4.1   No results for input(s): LIPASE, AMYLASE in the last 168 hours. No results for input(s): AMMONIA in the last 168 hours. CBC: Recent Labs  Lab 09/11/21 1650 09/12/21 0503  WBC 10.5 8.6  NEUTROABS  --  8.1*  HGB 15.4* 13.9  HCT 45.1 40.9  MCV 91.7 92.1  PLT 261 263   Cardiac Enzymes: No results for input(s): CKTOTAL, CKMB, CKMBINDEX, TROPONINI in the last 168 hours. BNP: Invalid input(s): POCBNP CBG: No results for input(s): GLUCAP in the last 168 hours. D-Dimer No results for input(s): DDIMER in the last  72 hours. Hgb A1c No results for input(s): HGBA1C in the last 72 hours. Lipid Profile No results for input(s): CHOL, HDL, LDLCALC, TRIG, CHOLHDL, LDLDIRECT in the last 72 hours. Thyroid function studies No results for input(s): TSH, T4TOTAL, T3FREE, THYROIDAB in the last 72 hours.  Invalid input(s): FREET3 Anemia work up No results for input(s): VITAMINB12, FOLATE, FERRITIN, TIBC, IRON, RETICCTPCT in the last 72 hours. Urinalysis    Component Value Date/Time   COLORURINE YELLOW 06/17/2016 2128   APPEARANCEUR HAZY (A) 06/17/2016 2128   LABSPEC 1.020 06/17/2016 2128   PHURINE 6.0 06/17/2016 2128   GLUCOSEU NEGATIVE 06/17/2016 2128   HGBUR NEGATIVE 06/17/2016 2128   BILIRUBINUR NEGATIVE 06/17/2016 2128   KETONESUR NEGATIVE 06/17/2016 2128   PROTEINUR NEGATIVE 06/17/2016 2128   NITRITE NEGATIVE 06/17/2016 2128   LEUKOCYTESUR NEGATIVE 06/17/2016 2128   Sepsis Labs Invalid input(s): PROCALCITONIN,  WBC,  LACTICIDVEN Microbiology Recent Results (from the past 240 hour(s))  Resp Panel by RT-PCR (Flu A&B, Covid) Nasopharyngeal Swab     Status: None   Collection Time: 09/11/21  5:52 PM   Specimen: Nasopharyngeal Swab; Nasopharyngeal(NP) swabs in vial transport medium  Result Value Ref Range Status   SARS Coronavirus 2 by RT PCR NEGATIVE NEGATIVE Final     Comment: (NOTE) SARS-CoV-2 target nucleic acids are NOT DETECTED.  The SARS-CoV-2 RNA is generally detectable in upper respiratory specimens during the acute phase of infection. The lowest concentration of SARS-CoV-2 viral copies this assay can detect is 138 copies/mL. A negative result does not preclude SARS-Cov-2 infection and should not be used as the sole basis for treatment or other patient management decisions. A negative result may occur with  improper specimen collection/handling, submission of specimen other than nasopharyngeal swab, presence of viral mutation(s) within the areas targeted by this assay, and inadequate number of viral copies(<138 copies/mL). A negative result must be combined with clinical observations, patient history, and epidemiological information. The expected result is Negative.  Fact Sheet for Patients:  EntrepreneurPulse.com.au  Fact Sheet for Healthcare Providers:  IncredibleEmployment.be  This test is no t yet approved or cleared by the [place] FDA and  has been authorized for detection and/or diagnosis of SARS-CoV-2 by FDA under an Emergency Use Authorization (EUA). This EUA will remain  in effect (meaning this test can be used) for the duration of the COVID-19 declaration under Section 564(b)(1) of the Act, 21 U.S.C.section 360bbb-3(b)(1), unless the authorization is terminated  or revoked sooner.       Influenza A by PCR NEGATIVE NEGATIVE Final   Influenza B by PCR NEGATIVE NEGATIVE Final    Comment: (NOTE) The Xpert Xpress SARS-CoV-2/FLU/RSV plus assay is intended as an aid in the diagnosis of influenza from Nasopharyngeal swab specimens and should not be used as a sole basis for treatment. Nasal washings and aspirates are unacceptable for Xpert Xpress SARS-CoV-2/FLU/RSV testing.  Fact Sheet for Patients: EntrepreneurPulse.com.au  Fact Sheet for Healthcare  Providers: IncredibleEmployment.be  This test is not yet approved or cleared by the [place] FDA and has been authorized for detection and/or diagnosis of SARS-CoV-2 by FDA under an Emergency Use Authorization (EUA). This EUA will remain in effect (meaning this test can be used) for the duration of the COVID-19 declaration under Section 564(b)(1) of the Act, 21 U.S.C. section 360bbb-3(b)(1), unless the authorization is terminated or revoked.  Performed at [facility], [address] [place], [place] [address]      Time coordinating discharge: Over 30 minutes  SIGNED:  Rollyn Scialdone J [place] DO  [facility] 09/12/2021, 10:47 AM

## 2021-12-08 ENCOUNTER — Emergency Department (EMERGENCY_DEPARTMENT_HOSPITAL)
Admission: EM | Admit: 2021-12-08 | Discharge: 2021-12-08 | Disposition: A | Discharge status: Other Acute Inpatient Hospital | Payer: Medicaid Other | Source: Home / Self Care | Attending: Emergency Medicine | Admitting: Emergency Medicine

## 2021-12-08 ENCOUNTER — Inpatient Hospital Stay
Admission: AD | Admit: 2021-12-08 | Discharge: 2021-12-10 | DRG: 885 | Disposition: A | Discharge status: Home / Self Care | Payer: Medicaid Other | Source: Intra-hospital | Attending: Psychiatry | Admitting: Psychiatry

## 2021-12-08 ENCOUNTER — Encounter: Payer: Self-pay | Admitting: Psychiatry

## 2021-12-08 ENCOUNTER — Other Ambulatory Visit: Payer: Self-pay

## 2021-12-08 DIAGNOSIS — Z20822 Contact with and (suspected) exposure to covid-19: Secondary | ICD-10-CM | POA: Insufficient documentation

## 2021-12-08 DIAGNOSIS — Z79899 Other long term (current) drug therapy: Secondary | ICD-10-CM

## 2021-12-08 DIAGNOSIS — Z9151 Personal history of suicidal behavior: Secondary | ICD-10-CM | POA: Diagnosis not present

## 2021-12-08 DIAGNOSIS — R45851 Suicidal ideations: Secondary | ICD-10-CM | POA: Insufficient documentation

## 2021-12-08 DIAGNOSIS — F1290 Cannabis use, unspecified, uncomplicated: Secondary | ICD-10-CM | POA: Diagnosis present

## 2021-12-08 DIAGNOSIS — F419 Anxiety disorder, unspecified: Secondary | ICD-10-CM | POA: Diagnosis present

## 2021-12-08 DIAGNOSIS — F332 Major depressive disorder, recurrent severe without psychotic features: Secondary | ICD-10-CM | POA: Insufficient documentation

## 2021-12-08 DIAGNOSIS — F32A Depression, unspecified: Secondary | ICD-10-CM

## 2021-12-08 LAB — URINE DRUG SCREEN, QUALITATIVE (ARMC ONLY)
Amphetamines, Ur Screen: NOT DETECTED
Barbiturates, Ur Screen: NOT DETECTED
Benzodiazepine, Ur Scrn: NOT DETECTED
Cannabinoid 50 Ng, Ur ~~LOC~~: POSITIVE — AB
Cocaine Metabolite,Ur ~~LOC~~: NOT DETECTED
MDMA (Ecstasy)Ur Screen: NOT DETECTED
Methadone Scn, Ur: NOT DETECTED
Opiate, Ur Screen: NOT DETECTED
Phencyclidine (PCP) Ur S: NOT DETECTED
Tricyclic, Ur Screen: NOT DETECTED

## 2021-12-08 LAB — COMPREHENSIVE METABOLIC PANEL
ALT: 12 U/L (ref 0–44)
AST: 15 U/L (ref 15–41)
Albumin: 4.7 g/dL (ref 3.5–5.0)
Alkaline Phosphatase: 70 U/L (ref 38–126)
Anion gap: 7 (ref 5–15)
BUN: 12 mg/dL (ref 6–20)
CO2: 25 mmol/L (ref 22–32)
Calcium: 9.4 mg/dL (ref 8.9–10.3)
Chloride: 105 mmol/L (ref 98–111)
Creatinine, Ser: 0.76 mg/dL (ref 0.44–1.00)
GFR, Estimated: >60 mL/min (ref >60)
Glucose, Bld: 99 mg/dL (ref 70–99)
Potassium: 4 mmol/L (ref 3.5–5.1)
Sodium: 137 mmol/L (ref 135–145)
Total Bilirubin: 0.9 mg/dL (ref 0.3–1.2)
Total Protein: 8.2 g/dL — ABNORMAL HIGH (ref 6.5–8.1)

## 2021-12-08 LAB — CBC
HCT: 45 % (ref 36.0–46.0)
Hemoglobin: 15 g/dL (ref 12.0–15.0)
MCH: 30.7 pg (ref 26.0–34.0)
MCHC: 33.3 g/dL (ref 30.0–36.0)
MCV: 92.2 fL (ref 80.0–100.0)
Platelets: 256 10*3/uL (ref 150–400)
RBC: 4.88 MIL/uL (ref 3.87–5.11)
RDW: 12 % (ref 11.5–15.5)
WBC: 8.1 10*3/uL (ref 4.0–10.5)
nRBC: 0 % (ref 0.0–0.2)

## 2021-12-08 LAB — SALICYLATE LEVEL: Salicylate Lvl: <7 mg/dL — ABNORMAL LOW (ref 7.0–30.0)

## 2021-12-08 LAB — RESP PANEL BY RT-PCR (FLU A&B, COVID) ARPGX2
Influenza A by PCR: NEGATIVE
Influenza B by PCR: NEGATIVE
SARS Coronavirus 2 by RT PCR: NEGATIVE

## 2021-12-08 LAB — ACETAMINOPHEN LEVEL: Acetaminophen (Tylenol), Serum: <10 ug/mL — ABNORMAL LOW (ref 10–30)

## 2021-12-08 LAB — POC URINE PREG, ED: Preg Test, Ur: NEGATIVE

## 2021-12-08 LAB — ETHANOL: Alcohol, Ethyl (B): <10 mg/dL (ref <10)

## 2021-12-08 MED ORDER — NICOTINE 14 MG/24HR TD PT24
14.0000 mg | MEDICATED_PATCH | Freq: Every day | TRANSDERMAL | Status: DC
Start: 2021-12-09 — End: 2021-12-10
  Filled 2021-12-08 (×2): qty 1

## 2021-12-08 MED ORDER — ALUM & MAG HYDROXIDE-SIMETH 200-200-20 MG/5ML PO SUSP: 30.0000 mL | ORAL | Status: DC | PRN

## 2021-12-08 MED ORDER — NORETHIN-ETH ESTRAD-FE BIPHAS 1 MG-10 MCG / 10 MCG PO TABS: 1.0000 | ORAL_TABLET | Freq: Every day | ORAL | Status: DC

## 2021-12-08 MED ORDER — MAGNESIUM HYDROXIDE 400 MG/5ML PO SUSP
30.0000 mL | Freq: Every day | ORAL | Status: DC | PRN
Start: 2021-12-08 — End: 2021-12-10

## 2021-12-08 MED ORDER — ACETAMINOPHEN 325 MG PO TABS
650.0000 mg | ORAL_TABLET | Freq: Four times a day (QID) | ORAL | Status: DC | PRN
Start: 2021-12-08 — End: 2021-12-10

## 2021-12-08 MED ORDER — HYDROXYZINE HCL 25 MG PO TABS
25.0000 mg | ORAL_TABLET | Freq: Every day | ORAL | Status: DC
  Administered 2021-12-08 – 2021-12-09 (×2): 25 mg via ORAL
  Filled 2021-12-08 (×2): qty 1

## 2021-12-08 NOTE — ED Notes (Signed)
Kelly Ponce

## 2021-12-08 NOTE — ED Triage Notes (Signed)
Pt here with SI. Pt called her mother and states "She did not want to be here anymore" and that she wanted to cut herself. When asked if she had any contributing factors, pt states "everything: Pt crying in triage.

## 2021-12-08 NOTE — ED Notes (Addendum)
Pt belongings:  1 pair of multi-colored Crocs 1 pair of blue and white leggings 1 pair of gray socks 1 gray tank top 1 pair of yellow and white underwear 1 gray sports bra 1 gray hair tie

## 2021-12-08 NOTE — ED Notes (Signed)
Pt given breakfast tray at this time. 

## 2021-12-08 NOTE — Consult Note (Signed)
[place] [facility] Consult   Reason for Consult:  suicidal ideations Referring Physician:  EDP Patient Identification: Kelly Ponce MRN:  [id number] Principal Diagnosis: Major depressive disorder, recurrent severe without psychotic features ([place]) Diagnosis:  Principal Problem:   Major depressive disorder, recurrent severe without psychotic features ([place])   Total Time spent with patient: 45 minutes  Subjective:   Kelly Ponce is a 21 y.o. female patient admitted with suicidal ideations, plan to overdose.  HPI:  21 yo female with depression and suicidal ideations.  She has a plan to overdose, overwhelmed with stressors and no support system.  High depression with one attempt last fall.  History of cutting to "feel something", last episode in the fall.  High anxiety, no panic attacks.  She drinks alcohol every other weekend of a bottle of liquor over the two days.  No psychosis or paranoia.  Poor sleep and appetite, anhedonia.  Endorses feelings of worthlessness, helplessness, and hopelessness.  Agreeable to admit inpatient to stabilize.  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  Past Psychiatric History: depression, anxiety  Risk to Self:  yes Risk to Others:  none Prior Inpatient Therapy:  none Prior Outpatient Therapy:  none  Past Medical History: History reviewed. No pertinent past medical history. History reviewed. No pertinent surgical history. Family History: History reviewed. No pertinent family history. Family Psychiatric  History: none Social History:  Social History   Substance and Sexual Activity  Alcohol Use No     Social History   Substance and Sexual Activity  Drug Use No    Social History   Socioeconomic History   Marital status: Single    Spouse name: Not on file   Number of children: Not on file   Years of education: Not on file   Highest education level: Not on file  Occupational History   Not on file   Tobacco Use   Smoking status: Never   Smokeless tobacco: Never  Vaping Use   Vaping Use: Never used  Substance and Sexual Activity   Alcohol use: No   Drug use: No   Sexual activity: Not on file  Other Topics Concern   Not on file  Social History Narrative   Not on file   Social Determinants of Health   Financial Resource Strain: Not on file  Food Insecurity: Not on file  Transportation Needs: Not on file  Physical Activity: Not on file  Stress: Not on file  Social Connections: Not on file   Additional Social History:    Allergies:  No Known Allergies  Labs:  Results for orders placed or performed during the hospital encounter of 12/08/21 (from the past 48 hour(s))  Comprehensive metabolic panel     Status: Abnormal   Collection Time: 12/08/21  9:05 AM  Result Value Ref Range   Sodium 137 135 - 145 mmol/L   Potassium 4.0 3.5 - 5.1 mmol/L   Chloride 105 98 - 111 mmol/L   CO2 25 22 - 32 mmol/L   Glucose, Bld 99 70 - 99 mg/dL    Comment: Glucose reference range applies only to samples taken after fasting for at least 8 hours.   BUN 12 6 - 20 mg/dL   Creatinine, Ser 0.76 0.44 - 1.00 mg/dL   Calcium 9.4 8.9 - 10.3 mg/dL   Total Protein 8.2 (H) 6.5 - 8.1 g/dL   Albumin 4.7 3.5 - 5.0 g/dL   AST 15 15 -  41 U/L   ALT 12 0 - 44 U/L   Alkaline Phosphatase 70 38 - 126 U/L   Total Bilirubin 0.9 0.3 - 1.2 mg/dL   GFR, Estimated >60 >60 mL/min    Comment: (NOTE) Calculated using the CKD-EPI Creatinine Equation (2021)    Anion gap 7 5 - 15    Comment: Performed at [facility], [address] [place], [place] [address]  Ethanol     Status: None   Collection Time: 12/08/21  9:05 AM  Result Value Ref Range   Alcohol, Ethyl (B) <10 <10 mg/dL    Comment: (NOTE) Lowest detectable limit for serum alcohol is 10 mg/dL.  For medical purposes only. Performed at [facility], [address] [place], [place] [id number]   Salicylate level     Status:  Abnormal   Collection Time: 12/08/21  9:05 AM  Result Value Ref Range   Salicylate Lvl Q000111Q (L) 7.0 - 30.0 mg/dL    Comment: Performed at [facility], [address] [place], [place] [address]  Acetaminophen level     Status: Abnormal   Collection Time: 12/08/21  9:05 AM  Result Value Ref Range   Acetaminophen (Tylenol), Serum <10 (L) 10 - 30 ug/mL    Comment: (NOTE) Therapeutic concentrations vary significantly. A range of 10-30 ug/mL  may be an effective concentration for many patients. However, some  are best treated at concentrations outside of this range. Acetaminophen concentrations >150 ug/mL at 4 hours after ingestion  and >50 ug/mL at 12 hours after ingestion are often associated with  toxic reactions.  Performed at [facility], [address] [place], [place] [address]   cbc     Status: None   Collection Time: 12/08/21  9:05 AM  Result Value Ref Range   WBC 8.1 4.0 - 10.5 K/uL   RBC 4.88 3.87 - 5.11 MIL/uL   Hemoglobin 15.0 12.0 - 15.0 g/dL   HCT 45.0 36.0 - 46.0 %   MCV 92.2 80.0 - 100.0 fL   MCH 30.7 26.0 - 34.0 pg   MCHC 33.3 30.0 - 36.0 g/dL   RDW 12.0 11.5 - 15.5 %   Platelets 256 150 - 400 K/uL   nRBC 0.0 0.0 - 0.2 %    Comment: Performed at [facility], [address] [place], [place] [address]  Urine Drug Screen, Qualitative     Status: Abnormal   Collection Time: 12/08/21  9:05 AM  Result Value Ref Range   Tricyclic, Ur Screen NONE DETECTED NONE DETECTED   Amphetamines, Ur Screen NONE DETECTED NONE DETECTED   MDMA (Ecstasy)Ur Screen NONE DETECTED NONE DETECTED   Cocaine Metabolite,Ur [place] NONE DETECTED NONE DETECTED   Opiate, Ur Screen NONE DETECTED NONE DETECTED   Phencyclidine (PCP) Ur S NONE DETECTED NONE DETECTED   Cannabinoid 50 Ng, Ur [place] POSITIVE (A) NONE DETECTED   Barbiturates, Ur Screen NONE DETECTED NONE DETECTED   Benzodiazepine, Ur Scrn NONE DETECTED NONE DETECTED   Methadone Scn, Ur NONE DETECTED NONE  DETECTED    Comment: (NOTE) Tricyclics + metabolites, urine    Cutoff 1000 ng/mL Amphetamines + metabolites, urine  Cutoff 1000 ng/mL MDMA (Ecstasy), urine              Cutoff 500 ng/mL Cocaine Metabolite, urine          Cutoff 300 ng/mL Opiate + metabolites, urine        Cutoff 300 ng/mL Phencyclidine (PCP), urine  Cutoff 25 ng/mL Cannabinoid, urine                 Cutoff 50 ng/mL Barbiturates + metabolites, urine  Cutoff 200 ng/mL Benzodiazepine, urine              Cutoff 200 ng/mL Methadone, urine                   Cutoff 300 ng/mL  The urine drug screen provides only a preliminary, unconfirmed analytical test result and should not be used for non-medical purposes. Clinical consideration and professional judgment should be applied to any positive drug screen result due to possible interfering substances. A more specific alternate chemical method must be used in order to obtain a confirmed analytical result. Gas chromatography / mass spectrometry (GC/MS) is the preferred confirm atory method. Performed at [facility], [address] [place], [place] [address]   POC urine preg, ED     Status: None   Collection Time: 12/08/21  9:22 AM  Result Value Ref Range   Preg Test, Ur NEGATIVE NEGATIVE    Comment:        THE SENSITIVITY OF THIS METHODOLOGY IS >24 mIU/mL     No current facility-administered medications for this encounter.   Current Outpatient Medications  Medication Sig Dispense Refill   albuterol (VENTOLIN HFA) 108 (90 Base) MCG/ACT inhaler Inhale 1-2 puffs into the lungs every 6 (six) hours as needed for wheezing or shortness of breath. 1 each 0   DIFFERIN 0.3 % gel Apply thin layer to face nightly as directed (Patient not taking: Reported on 09/12/2021) 45 g 2   DIFFERIN 0.3 % gel Apply topically to face at bedtime as directed. (Patient not taking: Reported on 09/12/2021) 45 g 5   LO LOESTRIN FE 1 MG-10 MCG / 10 MCG tablet Take 1 tablet by mouth  daily. (Patient not taking: Reported on 12/08/2021)      Musculoskeletal: Strength & Muscle Tone: within normal limits Gait & Station: normal Patient leans: N/A  Psychiatric Specialty Exam: Physical Exam Vitals and nursing note reviewed.  Constitutional:      Appearance: Normal appearance.  HENT:     Head: Normocephalic.     Nose: Nose normal.  Pulmonary:     Effort: Pulmonary effort is normal.  Musculoskeletal:        General: Normal range of motion.     Cervical back: Normal range of motion.  Neurological:     General: No focal deficit present.     Mental Status: She is alert and oriented to person, place, and time.  Psychiatric:        Attention and Perception: Attention and perception normal.        Mood and Affect: Mood is anxious and depressed.        Speech: Speech normal.        Behavior: Behavior normal. Behavior is cooperative.        Thought Content: Thought content includes suicidal ideation. Thought content includes suicidal plan.        Cognition and Memory: Cognition and memory normal.        Judgment: Judgment normal.    Review of Systems  Constitutional:  Positive for malaise/fatigue.  Psychiatric/Behavioral:  Positive for depression and suicidal ideas. The patient is nervous/anxious.   All other systems reviewed and are negative.  Blood pressure 106/75, pulse 75, temperature 98.7 F (37.1 C), temperature source Oral, resp. rate 18, height 5\' 3"  (1.6 m), weight  63.5 kg, last menstrual period 11/30/2021, SpO2 100 %.Body mass index is 24.8 kg/m.  General Appearance: Casual  Eye Contact:  Fair  Speech:  Normal Rate  Volume:  Decreased  Mood:  Anxious and Depressed  Affect:  Congruent  Thought Process:  Coherent and Descriptions of Associations: Intact  Orientation:  Full (Time, Place, and Person)  Thought Content:  Rumination  Suicidal Thoughts:  Yes.  with intent/plan  Homicidal Thoughts:  No  Memory:  Immediate;   Fair Recent;   Fair Remote;   Fair   Judgement:  Fair  Insight:  Fair  Psychomotor Activity:  Decreased  Concentration:  Concentration: Fair and Attention Span: Fair  Recall:  [employer] of Knowledge:  Good  Language:  Good  Akathisia:  No  Handed:  Right  AIMS (if indicated):     Assets:  Housing Leisure Time Physical Health Resilience  ADL's:  Intact  Cognition:  WNL  Sleep:        Physical Exam: Physical Exam Vitals and nursing note reviewed.  Constitutional:      Appearance: Normal appearance.  HENT:     Head: Normocephalic.     Nose: Nose normal.  Pulmonary:     Effort: Pulmonary effort is normal.  Musculoskeletal:        General: Normal range of motion.     Cervical back: Normal range of motion.  Neurological:     General: No focal deficit present.     Mental Status: She is alert and oriented to person, place, and time.  Psychiatric:        Attention and Perception: Attention and perception normal.        Mood and Affect: Mood is anxious and depressed.        Speech: Speech normal.        Behavior: Behavior normal. Behavior is cooperative.        Thought Content: Thought content includes suicidal ideation. Thought content includes suicidal plan.        Cognition and Memory: Cognition and memory normal.        Judgment: Judgment normal.   Review of Systems  Constitutional:  Positive for malaise/fatigue.  Psychiatric/Behavioral:  Positive for depression and suicidal ideas. The patient is nervous/anxious.   All other systems reviewed and are negative. Blood pressure 106/75, pulse 75, temperature 98.7 F (37.1 C), temperature source Oral, resp. rate 18, height 5\' 3"  (1.6 m), weight 63.5 kg, last menstrual period 11/30/2021, SpO2 100 %. Body mass index is 24.8 kg/m.  Treatment Plan Summary: Daily contact with patient to assess and evaluate symptoms and progress in treatment, Medication management, and Plan : Admit to inpatient psychiatry for stabilization   Disposition: Recommend psychiatric  Inpatient admission when medically cleared.  Waylan Boga, NP 12/08/2021 10:53 AM

## 2021-12-08 NOTE — ED Notes (Signed)
Dinner tray given

## 2021-12-08 NOTE — ED Notes (Signed)

## 2021-12-08 NOTE — ED Provider Notes (Signed)
[facility] Provider Note    Event Date/Time   First MD Initiated Contact with Patient 12/08/21 [phone]     (approximate)   History   Suicidal   HPI  Kelly Ponce is a 21 y.o. female  here with SI. Pt reports that she has been depressed x 1 year, things have been "very bad." She was convinced to come in by family today after stating that she did not want to be alive. She has no prior psych hospitalizations. Denies any active drug/EtOH use. No medical history or complaints. Feels suicidal but denies HI. No hallucinations.       Physical Exam   Triage Vital Signs: ED Triage Vitals [12/08/21 0900]  Enc Vitals Group     BP 106/75     Pulse Rate 75     Resp 18     Temp 98.7 F (37.1 C)     Temp Source Oral     SpO2 100 %     Weight 140 lb (63.5 kg)     Height 5\' 3"  (1.6 m)     Head Circumference      Peak Flow      Pain Score 0     Pain Loc      Pain Edu?      Excl. in GC?     Most recent vital signs: Vitals:   12/08/21 0900  BP: 106/75  Pulse: 75  Resp: 18  Temp: 98.7 F (37.1 C)  SpO2: 100%     General: Awake, no distress.  CV:  Good peripheral perfusion.  Resp:  Normal effort.  Abd:  No distention.  Other:  Tearful, depressed mood with SI. No AVH. Anxious.   ED Results / Procedures / Treatments   Labs (all labs ordered are listed, but only abnormal results are displayed) Labs Reviewed  COMPREHENSIVE METABOLIC PANEL - Abnormal; Notable for the following components:      Result Value   Total Protein 8.2 (*)    All other components within normal limits  SALICYLATE LEVEL - Abnormal; Notable for the following components:   Salicylate Lvl <7.0 (*)    All other components within normal limits  ACETAMINOPHEN LEVEL - Abnormal; Notable for the following components:   Acetaminophen (Tylenol), Serum <10 (*)    All other components within normal limits  CBC  ETHANOL  URINE DRUG SCREEN, QUALITATIVE ([facility] ONLY)  POC URINE PREG, ED      EKG    RADIOLOGY   PROCEDURES:  Critical Care performed: No   MEDICATIONS ORDERED IN ED: Medications - No data to display   IMPRESSION / MDM / ASSESSMENT AND PLAN / ED COURSE  I reviewed the triage vital signs and the nursing notes.                               Ddx:  Differential includes the following, with pertinent life- or limb-threatening emergencies considered:  MDD with suicidal ideation, substance-induced mood d/o, anxiety   MDM:  21 yo F with no prior psych history here with worsening depression and SI. Pt tearful, +SI on exam. Does not appear intoxicated. No apparent medical emergency. Screening labs reviewed. CMP unremarkable. UPT neg. Etoh and apap negative. CBC unremarkable, no leukocytosis or anemia. TTS/Psych consulted. Voluntary at this time but will IVC if she attempts to leave prior to psych eval.   MEDICATIONS GIVEN IN ED: Medications - No  data to display   Consults:  Psych TTS   EMR reviewed  Reviewed - no prior psych notes noted       FINAL CLINICAL IMPRESSION(S) / ED DIAGNOSES   Final diagnoses:  Suicidal ideations  Depression, unspecified depression type     Rx / DC Orders   ED Discharge Orders     None        Note:  This document was prepared using Dragon voice recognition software and may include unintentional dictation errors.   Shaune Pollack, MD 12/08/21 [phone]

## 2021-12-08 NOTE — Tx Team (Signed)
Initial Treatment Plan 12/08/2021 9:27 PM Kelly Ponce [id number]    PATIENT STRESSORS: Substance abuse   Traumatic event     PATIENT STRENGTHS: Ability for insight  Communication skills  Motivation for treatment/growth  Supportive family/friends    PATIENT IDENTIFIED PROBLEMS: Substance abuse  "I feel like cutting my wrist"  "I am not worth anything"                 DISCHARGE CRITERIA:  Improved stabilization in mood, thinking, and/or behavior Motivation to continue treatment in a less acute level of care Need for constant or close observation no longer present Verbal commitment to aftercare and medication compliance  PRELIMINARY DISCHARGE PLAN: Outpatient therapy Return to previous living arrangement Return to previous work or school arrangements  PATIENT/FAMILY INVOLVEMENT: This treatment plan has been presented to and reviewed with the patient, Kelly Ponce, and/or family member.  The patient and family have been given the opportunity to ask questions and make suggestions.  Margarita Rana, RN 12/08/2021, 9:27 PM

## 2021-12-08 NOTE — ED Notes (Signed)
LUNCH TRAY GIVEN. 

## 2021-12-09 MED ORDER — ESCITALOPRAM OXALATE 10 MG PO TABS
10.0000 mg | ORAL_TABLET | Freq: Every day | ORAL | Status: DC
  Administered 2021-12-09 – 2021-12-10 (×2): 10 mg via ORAL
  Filled 2021-12-09 (×2): qty 1
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
## 2021-12-09 NOTE — Progress Notes (Signed)
Recreation Therapy Notes  Date: 12/09/2021  Time: 10:40am    Location: Craft room   Behavioral response: N/A   Intervention Topic: Relaxation    Discussion/Intervention: Patient refused to attend group.   Clinical Observations/Feedback:  Patient refused to attend group.    Judieth Mckown LRT/CTRS         Kelly Ponce 12/09/2021 12:14 PM 

## 2021-12-09 NOTE — Progress Notes (Signed)
Patient refused her Nicotine patch, stating that she doesn't need it.
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
## 2021-12-09 NOTE — Progress Notes (Signed)
Patient calm and pleasant during assessment. Pt denies SI/HI/AVH. Pt endorses anxiety and depression. Pt observed interacting appropriately with staff and peers on the unit. Pt compliant with medication administration per MD orders. Pt being monitored Q 15 minutes for safety per unit protocol. Pt remains safe on the unit.

## 2021-12-09 NOTE — Progress Notes (Signed)
Patient has been down in the dayroom since dinner time, watching television, with other members on the unit. Patient has not had any issues thus far and remains safe on the unit.

## 2021-12-09 NOTE — Group Note (Signed)
[facility] LCSW Group Therapy Note   Group Date: 12/09/2021 Start Time: 1300 End Time: 1400  Type of Therapy/Topic:  Group Therapy:  Feelings about Diagnosis  Participation Level:  Active   Description of Group:    This group will allow patients to explore their thoughts and feelings about diagnoses they have received. Patients will be guided to explore their level of understanding and acceptance of these diagnoses. Facilitator will encourage patients to process their thoughts and feelings about the reactions of others to their diagnosis, and will guide patients in identifying ways to discuss their diagnosis with significant others in their lives. This group will be process-oriented, with patients participating in exploration of their own experiences as well as giving and receiving support and challenge from other group members.   Therapeutic Goals: 1. Patient will demonstrate understanding of diagnosis as evidence by identifying two or more symptoms of the disorder:  2. Patient will be able to express two feelings regarding the diagnosis 3. Patient will demonstrate ability to communicate their needs through discussion and/or role plays  Summary of Patient Progress: Pt was present for the entirety of the group. She was involved in the discussion and stated that she is happy to have a diagnosis as it gives her a place from which to work in order to get better. Pt did share that she has always struggled with being able to talk to people in her life but stated plans to begin communicating better.   Therapeutic Modalities:   Cognitive Behavioral Therapy Brief Therapy Feelings Identification    Glenis Smoker, LCSW

## 2021-12-09 NOTE — Plan of Care (Signed)
D- Patient alert and oriented. Patient presents in a pleasant mood on assessment stating that she slept "good" last night and had no complaints to voice to this Clinical research associate. Patient endorsed depression, anxiety and hopelessness on her self-inventory. When this writer asked patient about what has her feeling this way, she stated "everything, I've been stressing a lot, and being around people that think I'm weird or awkward". Patient denies SI, HI, AVH, and pain at this time. Patient's stated goal for today is "my anxiety", in which "staying relaxed and calm" will help her achieve her goal.  A- Support and encouragement provided. Routine safety checks conducted every 15 minutes. Patient informed to notify staff with problems or concerns.  R- Patient contracts for safety at this time. Patient compliant with treatment plan. Patient receptive, calm, and cooperative. Patient interacts well with others on the unit. Patient remains safe at this time.  Problem: Education: Goal: Knowledge of [place] [facility] Education information/materials will improve Outcome: Progressing Goal: Emotional status will improve Outcome: Progressing Goal: Mental status will improve Outcome: Progressing Goal: Verbalization of understanding the information provided will improve Outcome: Progressing   Problem: Activity: Goal: Interest or engagement in activities will improve Outcome: Progressing Goal: Sleeping patterns will improve Outcome: Progressing   Problem: Coping: Goal: Ability to verbalize frustrations and anger appropriately will improve Outcome: Progressing Goal: Ability to demonstrate self-control will improve Outcome: Progressing   Problem: Health Behavior/Discharge Planning: Goal: Identification of resources available to assist in meeting health care needs will improve Outcome: Progressing Goal: Compliance with treatment plan for underlying cause of condition will improve Outcome: Progressing   Problem:  Physical Regulation: Goal: Ability to maintain clinical measurements within normal limits will improve Outcome: Progressing   Problem: Safety: Goal: Periods of time without injury will increase Outcome: Progressing   Problem: Education: Goal: Utilization of techniques to improve thought processes will improve Outcome: Progressing Goal: Knowledge of the prescribed therapeutic regimen will improve Outcome: Progressing   Problem: Activity: Goal: Interest or engagement in leisure activities will improve Outcome: Progressing Goal: Imbalance in normal sleep/wake cycle will improve Outcome: Progressing   Problem: Coping: Goal: Coping ability will improve Outcome: Progressing Goal: Will verbalize feelings Outcome: Progressing   Problem: Health Behavior/Discharge Planning: Goal: Ability to make decisions will improve Outcome: Progressing Goal: Compliance with therapeutic regimen will improve Outcome: Progressing   Problem: Role Relationship: Goal: Will demonstrate positive changes in social behaviors and relationships Outcome: Progressing   Problem: Safety: Goal: Ability to disclose and discuss suicidal ideas will improve Outcome: Progressing Goal: Ability to identify and utilize support systems that promote safety will improve Outcome: Progressing   Problem: Self-Concept: Goal: Will verbalize positive feelings about self Outcome: Progressing Goal: Level of anxiety will decrease Outcome: Progressing   Problem: Education: Goal: Ability to state activities that reduce stress will improve Outcome: Progressing   Problem: Coping: Goal: Ability to identify and develop effective coping behavior will improve Outcome: Progressing   Problem: Self-Concept: Goal: Ability to identify factors that promote anxiety will improve Outcome: Progressing Goal: Level of anxiety will decrease Outcome: Progressing Goal: Ability to modify response to factors that promote anxiety will  improve Outcome: Progressing   Problem: Education: Goal: Ability to make informed decisions regarding treatment will improve Outcome: Progressing   Problem: Coping: Goal: Coping ability will improve Outcome: Progressing   Problem: Health Behavior/Discharge Planning: Goal: Identification of resources available to assist in meeting health care needs will improve Outcome: Progressing   Problem: Medication: Goal: Compliance with prescribed medication  regimen will improve Outcome: Progressing   Problem: Self-Concept: Goal: Ability to disclose and discuss suicidal ideas will improve Outcome: Progressing Goal: Will verbalize positive feelings about self Outcome: Progressing   Problem: Education: Goal: Ability to incorporate positive changes in behavior to improve self-esteem will improve Outcome: Progressing   Problem: Health Behavior/Discharge Planning: Goal: Ability to identify and utilize available resources and services will improve Outcome: Progressing Goal: Ability to remain free from injury will improve Outcome: Progressing   Problem: Self-Concept: Goal: Will verbalize positive feelings about self Outcome: Progressing   Problem: Skin Integrity: Goal: Demonstration of wound healing without infection will improve Outcome: Progressing

## 2021-12-10 DIAGNOSIS — F332 Major depressive disorder, recurrent severe without psychotic features: Secondary | ICD-10-CM

## 2021-12-10 MED ORDER — ESCITALOPRAM OXALATE 10 MG PO TABS
10.0000 mg | ORAL_TABLET | Freq: Every day | ORAL | 1 refills | Status: DC
Start: 2021-12-11 — End: 2022-09-22

## 2021-12-10 NOTE — Progress Notes (Signed)
Recreation Therapy Notes  Date: 12/10/2021   Time: 10:45am   Location: Courtyard      Behavioral response: Appropriate   Intervention Topic: Leisure     Discussion/Intervention:  Group content today was focused on leisure. The group defined what leisure is and some positive leisure activities they participate in. Individuals identified the difference between good and bad leisure. Participants expressed how they feel after participating in the leisure of their choice. The group discussed how they go about picking a leisure activity and if others are involved in their leisure activities. The patient stated how many leisure activities they have to choose from and reasons why it is important to have leisure time. Individuals participated in the intervention "Exploration of Leisure" where they had a chance to identify new leisure activities as well as benefits of leisure. Clinical Observations/Feedback: Patient came to group and was able to explore participate in many leisure activities. Participant was able to identify leisure activities they enjoy outside of the hospital. Individual was social with peers and staff while participating in the intervention.    Young Mulvey LRT/CTRS         Giani Winther 12/10/2021 1:16 PM

## 2021-12-10 NOTE — [facility] Suicide Risk Assessment (Signed)
[facility] Discharge Suicide Risk Assessment   Principal Problem: Major depressive disorder, recurrent severe without psychotic features (HCC) Discharge Diagnoses: Principal Problem:   Major depressive disorder, recurrent severe without psychotic features (HCC)   Total Time spent with patient: 30 minutes  Musculoskeletal: Strength & Muscle Tone: within normal limits Gait & Station: normal Patient leans: N/A  Psychiatric Specialty Exam  Presentation  General Appearance: No data recorded Eye Contact:No data recorded Speech:No data recorded Speech Volume:No data recorded Handedness:No data recorded  Mood and Affect  Mood:No data recorded Duration of Depression Symptoms: No data recorded Affect:No data recorded  Thought Process  Thought Processes:No data recorded Descriptions of Associations:No data recorded Orientation:No data recorded Thought Content:No data recorded History of Schizophrenia/Schizoaffective disorder:No data recorded Duration of Psychotic Symptoms:No data recorded Hallucinations:No data recorded Ideas of Reference:No data recorded Suicidal Thoughts:No data recorded Homicidal Thoughts:No data recorded  Sensorium  Memory:No data recorded Judgment:No data recorded Insight:No data recorded  Executive Functions  Concentration:No data recorded Attention Span:No data recorded Recall:No data recorded Fund of Knowledge:No data recorded Language:No data recorded  Psychomotor Activity  Psychomotor Activity:No data recorded  Assets  Assets:No data recorded  Sleep  Sleep:No data recorded  Physical Exam: Physical Exam Vitals and nursing note reviewed.  Constitutional:      Appearance: Normal appearance.  HENT:     Head: Normocephalic and atraumatic.     Mouth/Throat:     Pharynx: Oropharynx is clear.  Eyes:     Pupils: Pupils are equal, round, and reactive to light.  Cardiovascular:     Rate and Rhythm: Normal rate and regular rhythm.  Pulmonary:      Effort: Pulmonary effort is normal.     Breath sounds: Normal breath sounds.  Abdominal:     General: Abdomen is flat.     Palpations: Abdomen is soft.  Musculoskeletal:        General: Normal range of motion.  Skin:    General: Skin is warm and dry.  Neurological:     General: No focal deficit present.     Mental Status: She is alert. Mental status is at baseline.  Psychiatric:        Attention and Perception: Attention normal.        Mood and Affect: Mood normal.        Speech: Speech normal.        Behavior: Behavior normal.        Thought Content: Thought content normal.        Cognition and Memory: Cognition normal.        Judgment: Judgment normal.   Review of Systems  Constitutional: Negative.   HENT: Negative.    Eyes: Negative.   Respiratory: Negative.    Cardiovascular: Negative.   Gastrointestinal: Negative.   Musculoskeletal: Negative.   Skin: Negative.   Neurological: Negative.   Psychiatric/Behavioral: Negative.    Blood pressure 103/68, pulse 84, temperature 98.3 F (36.8 C), temperature source Oral, resp. rate 18, height 5\' 3"  (1.6 m), weight 63.5 kg, last menstrual period 11/30/2021, SpO2 100 %. Body mass index is 24.8 kg/m.  Mental Status Per Nursing Assessment::   On Admission:  Suicidal ideation indicated by patient  Demographic Factors:  Adolescent or young adult  Loss Factors: NA  Historical Factors: Impulsivity  Risk Reduction Factors:   Sense of responsibility to family, Employed, Living with another person, especially a relative, Positive social support, and Positive therapeutic relationship  Continued Clinical Symptoms:  Severe Anxiety and/or Agitation Depression:  Impulsivity  Cognitive Features That Contribute To Risk:  None    Suicide Risk:  Minimal: No identifiable suicidal ideation.  Patients presenting with no risk factors but with morbid ruminations; may be classified as minimal risk based on the severity of the depressive  symptoms   Follow-up Information     [facility] Follow up.   Why: Appointment is scheduled for 12/12/2021 at 11AM. Contact information: [address] [place] [address] [phone]                 Plan Of Care/Follow-up recommendations:  Patient is showing a positive upbeat attitude and affect.  She denies suicidal ideation.  She states that her mood and anxiety are feeling much improved.  She is agreeable to an appropriate plan for outpatient treatment and will be following up with [facility].  At this point does not appear to be an acute risk to self or others and can be discharged home.  Mordecai Rasmussen, MD 12/10/2021, 10:34 AM

## 2021-12-10 NOTE — Plan of Care (Signed)
D: Pt alert and oriented. Pt rates depression 1/10, hopelessness 1/10, and anxiety 2/10. Pt goal: "Being happy." Pt reports energy level as normal and concentration as being good. Pt reports sleep last night as being good. Pt did not receive medications for sleep. Pt denies experiencing any pain at this time. Pt denies experiencing any SI/HI, or AVH at this time.   A: Scheduled medications administered to pt, per MD orders. Support and encouragement provided. Frequent verbal contact made. Routine safety checks conducted q15 minutes.   R: No adverse drug reactions noted. Pt verbally contracts for safety at this time. Pt compliant with medications and treatment plan. Pt interacts well with others on the unit. Pt remains safe at this time. Will continue to monitor.   Problem: Education: Goal: Emotional status will improve Outcome: Progressing Goal: Mental status will improve Outcome: Progressing

## 2021-12-10 NOTE — Discharge Summary (Signed)
Physician Discharge Summary Note  Patient:  Kelly Ponce is an 21 y.o., female MRN:  [id number] DOB:  Jan 03, 2001 Patient phone:  [phone] (home)  Patient address:   [place] [address],  Total Time spent with patient: 30 minutes  Date of Admission:  12/08/2021 Date of Discharge: 12/10/2021  Reason for Admission: Patient presented to the emergency room with complaints of depression and suicidal ideation.  Admitted to the psychiatric ward for evaluation and treatment planning  Principal Problem: Major depressive disorder, recurrent severe without psychotic features [facility]) Discharge Diagnoses: Principal Problem:   Major depressive disorder, recurrent severe without psychotic features [facility])   Past Psychiatric History: Patient has a history of chronic anxiety and some past treatment not currently engaged.  Past Medical History: History reviewed. No pertinent past medical history. History reviewed. No pertinent surgical history. Family History: History reviewed. No pertinent family history. Family Psychiatric  History: See previous Social History:  Social History   Substance and Sexual Activity  Alcohol Use Yes   Alcohol/week: 4.0 standard drinks   Types: 4 Cans of beer per week     Social History   Substance and Sexual Activity  Drug Use Yes   Types: Marijuana    Social History   Socioeconomic History   Marital status: Single    Spouse name: Not on file   Number of children: Not on file   Years of education: Not on file   Highest education level: Not on file  Occupational History   Not on file  Tobacco Use   Smoking status: Never   Smokeless tobacco: Never  Vaping Use   Vaping Use: Every day  Substance and Sexual Activity   Alcohol use: Yes    Alcohol/week: 4.0 standard drinks    Types: 4 Cans of beer per week   Drug use: Yes    Types: Marijuana   Sexual activity: Not Currently  Other Topics Concern   Not on file  Social History  Narrative   Not on file   Social Determinants of Health   Financial Resource Strain: Not on file  Food Insecurity: Not on file  Transportation Needs: Not on file  Physical Activity: Not on file  Stress: Not on file  Social Connections: Not on file    Hospital Course: Patient admitted to the psychiatric unit.  She was cooperative and insightful during her time in the hospital.  Did not display any dangerous behavior.  Patient was agreeable to suggestion of starting medication for chronic anxiety and depression and was started on escitalopram 10 mg/day.  She met with treatment team today and stated that her mood was much better.  Anxiety was under control.  She has met with a representative from [facility] and is agreeable to a plan to follow up with [facility] mental health services.  Psychoeducation and supportive therapy completed.  At this point does not appear to be an acute danger to herself and appears to be safe to discharge home.  Patient given prescription for S-Citalopram 10 mg/day.  Follow up with [facility] for therapy and medication management and all necessary treatment.  Urged patient to avoid substance abuse.  Physical Findings: AIMS:  , ,  ,  ,    CIWA:    COWS:     Musculoskeletal: Strength & Muscle Tone: within normal limits Gait & Station: normal Patient leans: N/A   Psychiatric Specialty Exam:  Presentation  General Appearance: No data recorded Eye Contact:No data recorded Speech:No data  recorded Speech Volume:No data recorded Handedness:No data recorded  Mood and Affect  Mood:No data recorded Affect:No data recorded  Thought Process  Thought Processes:No data recorded Descriptions of Associations:No data recorded Orientation:No data recorded Thought Content:No data recorded History of Schizophrenia/Schizoaffective disorder:No data recorded Duration of Psychotic Symptoms:No data recorded Hallucinations:No data recorded Ideas of Reference:No data recorded Suicidal  Thoughts:No data recorded Homicidal Thoughts:No data recorded  Sensorium  Memory:No data recorded Judgment:No data recorded Insight:No data recorded  Executive Functions  Concentration:No data recorded Attention Span:No data recorded Recall:No data recorded Fund of Knowledge:No data recorded Language:No data recorded  Psychomotor Activity  Psychomotor Activity:No data recorded  Assets  Assets:No data recorded  Sleep  Sleep:No data recorded   Physical Exam: Physical Exam Vitals and nursing note reviewed.  Constitutional:      Appearance: Normal appearance.  HENT:     Head: Normocephalic and atraumatic.     Mouth/Throat:     Pharynx: Oropharynx is clear.  Eyes:     Pupils: Pupils are equal, round, and reactive to light.  Cardiovascular:     Rate and Rhythm: Normal rate and regular rhythm.  Pulmonary:     Effort: Pulmonary effort is normal.     Breath sounds: Normal breath sounds.  Abdominal:     General: Abdomen is flat.     Palpations: Abdomen is soft.  Musculoskeletal:        General: Normal range of motion.  Skin:    General: Skin is warm and dry.  Neurological:     General: No focal deficit present.     Mental Status: She is alert. Mental status is at baseline.  Psychiatric:        Attention and Perception: Attention normal.        Mood and Affect: Mood normal.        Speech: Speech normal.        Behavior: Behavior is cooperative.        Thought Content: Thought content normal.        Cognition and Memory: Cognition normal.        Judgment: Judgment normal.   Review of Systems  Constitutional: Negative.   HENT: Negative.    Eyes: Negative.   Respiratory: Negative.    Cardiovascular: Negative.   Gastrointestinal: Negative.   Musculoskeletal: Negative.   Skin: Negative.   Neurological: Negative.   Psychiatric/Behavioral: Negative.    Blood pressure 103/68, pulse 84, temperature 98.3 F (36.8 C), temperature source Oral, resp. rate 18, height 5'  3" (1.6 m), weight 63.5 kg, last menstrual period 11/30/2021, SpO2 100 %. Body mass index is 24.8 kg/m.   Social History   Tobacco Use  Smoking Status Never  Smokeless Tobacco Never   Tobacco Cessation:  A prescription for an FDA-approved tobacco cessation medication was offered at discharge and the patient refused   Blood Alcohol level:  Lab Results  Component Value Date   ETH <10 95/18/8416    Metabolic Disorder Labs:  No results found for: HGBA1C, MPG No results found for: PROLACTIN No results found for: CHOL, TRIG, HDL, CHOLHDL, VLDL, LDLCALC  See Psychiatric Specialty Exam and Suicide Risk Assessment completed by Attending Physician prior to discharge.  Discharge destination:  Home  Is patient on multiple antipsychotic therapies at discharge:  No   Has Patient had three or more failed trials of antipsychotic monotherapy by history:  No  Recommended Plan for Multiple Antipsychotic Therapies: NA  Discharge Instructions     Diet - low  sodium heart healthy   Complete by: As directed    Increase activity slowly   Complete by: As directed       Allergies as of 12/10/2021   No Known Allergies      Medication List     STOP taking these medications    Differin 0.3 % gel Generic drug: Adapalene       TAKE these medications      Indication  albuterol 108 (90 Base) MCG/ACT inhaler Commonly known as: Ventolin HFA Inhale 1-2 puffs into the lungs every 6 (six) hours as needed for wheezing or shortness of breath.  Indication: Asthma   escitalopram 10 MG tablet Commonly known as: LEXAPRO Take 1 tablet (10 mg total) by mouth daily. Start taking on: Dec 11, 2021  Indication: Generalized Anxiety Disorder, Major Depressive Disorder   Lo Loestrin Fe 1 MG-10 MCG / 10 MCG tablet Generic drug: Norethindrone-Ethinyl Estradiol-Fe Biphas Take 1 tablet by mouth daily.  Indication: Birth Control Treatment        Follow-up Information     [facility] Follow up.   Why: Appointment is scheduled for 12/12/2021 at 11AM. Contact information: [place] [address] [phone]                 Follow-up recommendations: Continue current medication.  Avoid substance abuse.  Follow-up with [facility]  Comments: Prescription provided  Signed: Alethia Berthold, MD 12/10/2021, 10:37 AM

## 2021-12-10 NOTE — [facility] Suicide Risk Assessment (Signed)
[facility] INPATIENT:  Family/Significant Other Suicide Prevention Education  Suicide Prevention Education:  Education Completed; Kelly Ponce, mother, [phone] has been identified by the patient as the family member/significant other with whom the patient will be residing, and identified as the person(s) who will aid the patient in the event of a mental health crisis (suicidal ideations/suicide attempt).  With written consent from the patient, the family member/significant other has been provided the following suicide prevention education, prior to the and/or following the discharge of the patient.  The suicide prevention education provided includes the following: Suicide risk factors Suicide prevention and interventions National Suicide Hotline telephone number [facility] assessment telephone number [facility] Emergency Assistance 911 [facility] and/or Residential Mobile Crisis Unit telephone number  Request made of family/significant other to: Remove weapons (e.g., guns, rifles, knives), all items previously/currently identified as safety concern.   Remove drugs/medications (over-the-counter, prescriptions, illicit drugs), all items previously/currently identified as a safety concern.  The family member/significant other verbalizes understanding of the suicide prevention education information provided.  The family member/significant other agrees to remove the items of safety concern listed above.  Mother reports that the patient "has been known to cut herself".  She reports that patient has a history of this behavior.  She reports that the patient told her "I'm weird, Im awkward, I feel out of place". Mother reports a history of depression.  Mother reports that patient stated "there was something in her voice that made me feel like she was serious".  She reports pt stated that "she has made comments that she did not want to be here anymore". Mother denies any access to  weapons.     Harden Mo 12/10/2021, 10:29 AM

## 2021-12-10 NOTE — Progress Notes (Signed)
D: Pt alert and oriented. Pt denies experiencing any pain, SI/HI, or AVH at this time. Pt reports he will be able to keep himself safe when he returns home.   A: Pt received discharge and medication education/information. Pt belongings were returned and signed for at this time to include printed prescription.   R: Pt verbalized understanding of discharge and medication education/information.  Pt escorted by staff to medical mall front lobby where pt was picked up by her mother.

## 2021-12-10 NOTE — Progress Notes (Signed)
  [facility] Adult Case Management Discharge Plan :  Will you be returning to the same living situation after discharge:  Yes,  Pt reports that she is returning home. At discharge, do you have transportation home?: Yes,  pt reports that her mother will provide transportation.  Do you have the ability to pay for your medications: Yes,  [place] Medicaid Prepaid Health Plan/ [place] Medicaid [employer].  Release of information consent forms completed and in the chart;  Patient's signature needed at discharge.  Patient to Follow up at:  Follow-up Information     [facility] Follow up.   Why: Appointment is scheduled for 12/12/2021 at 11AM. Contact information: [address] [place] [address] [phone]                 Next level of care provider has access to [facility] Link:no  Safety Planning and Suicide Prevention discussed: Yes,  SPE completed with the patient and patient's mother.      Has patient been referred to the Quitline?: Patient refused referral  Patient reports that she smokes cigarettes, she declined Quitline referral stating that she can quit "when I want to".   Patient has been referred for addiction treatment: Pt. refused referral  Harden Mo, LCSW 12/10/2021, 10:26 AM

## 2021-12-10 NOTE — BH IP Treatment Plan (Signed)
Interdisciplinary Treatment and Diagnostic Plan Update  12/10/2021 Time of Session: 9:30 AM Kelly Ponce MRN: [id number]  Principal Diagnosis: Major depressive disorder, recurrent severe without psychotic features (HCC)  Secondary Diagnoses: Principal Problem:   Major depressive disorder, recurrent severe without psychotic features (HCC)   Current Medications:  Current Facility-Administered Medications  Medication Dose Route Frequency Provider Last Rate Last Admin   acetaminophen (TYLENOL) tablet 650 mg  650 mg Oral Q6H PRN Charm Rings, NP       alum & mag hydroxide-simeth (MAALOX/MYLANTA) 200-200-20 MG/5ML suspension 30 mL  30 mL Oral Q4H PRN Charm Rings, NP       escitalopram (LEXAPRO) tablet 10 mg  10 mg Oral Daily Clapacs, Jackquline Denmark, MD   10 mg at 12/10/21 0751   hydrOXYzine (ATARAX) tablet 25 mg  25 mg Oral QHS Gillermo Murdoch, NP   25 mg at 12/09/21 2118   magnesium hydroxide (MILK OF MAGNESIA) suspension 30 mL  30 mL Oral Daily PRN Charm Rings, NP       nicotine (NICODERM CQ - dosed in mg/24 hours) patch 14 mg  14 mg Transdermal Daily Gillermo Murdoch, NP       PTA Medications: Medications Prior to Admission  Medication Sig Dispense Refill Last Dose   albuterol (VENTOLIN HFA) 108 (90 Base) MCG/ACT inhaler Inhale 1-2 puffs into the lungs every 6 (six) hours as needed for wheezing or shortness of breath. 1 each 0    DIFFERIN 0.3 % gel Apply thin layer to face nightly as directed (Patient not taking: Reported on 09/12/2021) 45 g 2    DIFFERIN 0.3 % gel Apply topically to face at bedtime as directed. (Patient not taking: Reported on 09/12/2021) 45 g 5    LO LOESTRIN FE 1 MG-10 MCG / 10 MCG tablet Take 1 tablet by mouth daily. (Patient not taking: Reported on 12/08/2021)       Patient Stressors: Substance abuse   Traumatic event    Patient Strengths: Ability for insight  Communication skills  Motivation for treatment/growth  Supportive family/friends    Treatment Modalities: Medication Management, Group therapy, Case management,  1 to 1 session with clinician, Psychoeducation, Recreational therapy.   Physician Treatment Plan for Primary Diagnosis: Major depressive disorder, recurrent severe without psychotic features (HCC) Long Term Goal(s):     Short Term Goals:    Medication Management: Evaluate patient's response, side effects, and tolerance of medication regimen.  Therapeutic Interventions: 1 to 1 sessions, Unit Group sessions and Medication administration.  Evaluation of Outcomes: Adequate for Discharge  Physician Treatment Plan for Secondary Diagnosis: Principal Problem:   Major depressive disorder, recurrent severe without psychotic features (HCC)  Long Term Goal(s):     Short Term Goals:       Medication Management: Evaluate patient's response, side effects, and tolerance of medication regimen.  Therapeutic Interventions: 1 to 1 sessions, Unit Group sessions and Medication administration.  Evaluation of Outcomes: Adequate for Discharge   RN Treatment Plan for Primary Diagnosis: Major depressive disorder, recurrent severe without psychotic features (HCC) Long Term Goal(s): Knowledge of disease and therapeutic regimen to maintain health will improve  Short Term Goals: Ability to remain free from injury will improve, Ability to verbalize frustration and anger appropriately will improve, Ability to demonstrate self-control, Ability to participate in decision making will improve, Ability to verbalize feelings will improve, Ability to disclose and discuss suicidal ideas, Ability to identify and develop effective coping behaviors will improve, and Compliance with prescribed medications  will improve  Medication Management: RN will administer medications as ordered by provider, will assess and evaluate patient's response and provide education to patient for prescribed medication. RN will report any adverse and/or side effects to  prescribing provider.  Therapeutic Interventions: 1 on 1 counseling sessions, Psychoeducation, Medication administration, Evaluate responses to treatment, Monitor vital signs and CBGs as ordered, Perform/monitor CIWA, COWS, AIMS and Fall Risk screenings as ordered, Perform wound care treatments as ordered.  Evaluation of Outcomes: Adequate for Discharge   LCSW Treatment Plan for Primary Diagnosis: Major depressive disorder, recurrent severe without psychotic features (HCC) Long Term Goal(s): Safe transition to appropriate next level of care at discharge, Engage patient in therapeutic group addressing interpersonal concerns.  Short Term Goals: Engage patient in aftercare planning with referrals and resources, Increase social support, Increase ability to appropriately verbalize feelings, Increase emotional regulation, Facilitate acceptance of mental health diagnosis and concerns, and Increase skills for wellness and recovery  Therapeutic Interventions: Assess for all discharge needs, 1 to 1 time with Social worker, Explore available resources and support systems, Assess for adequacy in community support network, Educate family and significant other(s) on suicide prevention, Complete Psychosocial Assessment, Interpersonal group therapy.  Evaluation of Outcomes: Adequate for Discharge   Progress in Treatment: Attending groups: Yes. Participating in groups: Yes. Taking medication as prescribed: Yes. Toleration medication: Yes. Family/Significant other contact made: No, will contact:  when given permission.  Patient understands diagnosis: Yes. Discussing patient identified problems/goals with staff: Yes. Medical problems stabilized or resolved: Yes. Denies suicidal/homicidal ideation: Yes. Issues/concerns per patient self-inventory: No. Other: none.  New problem(s) identified: No, Describe:  none identified at this time.   New Short Term/Long Term Goal(s): medication management for mood  stabilization; elimination of SI thoughts; development of comprehensive mental wellness plan.  Patient Goals:  "My anxiety and just my mental state. Trying to get better."   Discharge Plan or Barriers: Pt discharge set for today. She plans to return home with outpatient care.   Reason for Continuation of Hospitalization: Depression Medication stabilization Suicidal ideation  Estimated Length of Stay: 1-7 days  Last 3 [place] Suicide Severity Risk Score: Flowsheet Row Admission (Current) from 12/08/2021 in [facility] INPATIENT BEHAVIORAL MEDICINE Most recent reading at 12/08/2021  8:00 PM ED from 12/08/2021 in [facility] EMERGENCY DEPARTMENT Most recent reading at 12/08/2021  9:03 AM ED from 09/11/2021 in [facility] EMERGENCY DEPARTMENT Most recent reading at 09/11/2021  4:49 PM  C-SSRS RISK CATEGORY High Risk High Risk No Risk       Last PHQ 2/9 Scores:     View : No data to display.          Scribe for Treatment Team: Glenis Smoker, LCSW 12/10/2021 10:08 AM

## 2021-12-12 NOTE — H&P (Signed)
Psychiatric Admission Assessment Adult  Patient Identification: Kelly Ponce MRN:  [id number] Date of Evaluation:  12/09/2021 Chief Complaint:  Major depressive disorder, recurrent severe without psychotic features (HCC) [F33.2] Principal Diagnosis: Major depressive disorder, recurrent severe without psychotic features (HCC) Diagnosis:  Principal Problem:   Major depressive disorder, recurrent severe without psychotic features (HCC)  History of Present Illness: Patient presented to the emergency room with complaints of depression and anxiety of longstanding nature.  Passive suicidal thoughts without intention or plan.  No psychotic symptoms.  Not currently receiving any outpatient treatment. Associated Signs/Symptoms: Depression Symptoms:  depressed mood, anxiety, Duration of Depression Symptoms: No data recorded (Hypo) Manic Symptoms:   None Anxiety Symptoms:  Excessive Worry, Psychotic Symptoms:   None PTSD Symptoms: Negative Total Time spent with patient: 45 minutes  Past Psychiatric History: Past history of chronic anxiety and depression.  Some past treatment years ago nothing recent  Is the patient at risk to self? Yes.    Has the patient been a risk to self in the past 6 months? No.  Has the patient been a risk to self within the distant past? No.  Is the patient a risk to others? No.  Has the patient been a risk to others in the past 6 months? No.  Has the patient been a risk to others within the distant past? No.   Prior Inpatient Therapy:   Prior Outpatient Therapy:    Alcohol Screening: Patient refused Alcohol Screening Tool: Yes 1. How often do you have a drink containing alcohol?: 2 to 4 times a month 2. How many drinks containing alcohol do you have on a typical day when you are drinking?: 5 or 6 3. How often do you have six or more drinks on one occasion?: Less than monthly AUDIT-C Score: 5 4. How often during the last year have you found that you were not able to  stop drinking once you had started?: Never 5. How often during the last year have you failed to do what was normally expected from you because of drinking?: Never 6. How often during the last year have you needed a first drink in the morning to get yourself going after a heavy drinking session?: Never 7. How often during the last year have you had a feeling of guilt of remorse after drinking?: Never 8. How often during the last year have you been unable to remember what happened the night before because you had been drinking?: Never 9. Have you or someone else been injured as a result of your drinking?: No 10. Has a relative or friend or a doctor or another health worker been concerned about your drinking or suggested you cut down?: No Alcohol Use Disorder Identification Test Final Score (AUDIT): 5 Substance Abuse History in the last 12 months:  No. Consequences of Substance Abuse: Negative Previous Psychotropic Medications: No  Psychological Evaluations: No  Past Medical History: History reviewed. No pertinent past medical history. History reviewed. No pertinent surgical history. Family History: History reviewed. No pertinent family history. Family Psychiatric  History: None reported Tobacco Screening:   Social History:  Social History   Substance and Sexual Activity  Alcohol Use Yes   Alcohol/week: 4.0 standard drinks   Types: 4 Cans of beer per week     Social History   Substance and Sexual Activity  Drug Use Yes   Types: Marijuana    Additional Social History: Marital status: Single Does patient have children?: No  Specify valuables returned: Please  see admission                      Allergies:  No Known Allergies Lab Results: No results found for this or any previous visit (from the past 48 hour(s)).  Blood Alcohol level:  Lab Results  Component Value Date   ETH <10 12/08/2021    Metabolic Disorder Labs:  No results found for: HGBA1C, MPG No results found  for: PROLACTIN No results found for: CHOL, TRIG, HDL, CHOLHDL, VLDL, LDLCALC  Current Medications: No current facility-administered medications for this encounter.   Current Outpatient Medications  Medication Sig Dispense Refill   albuterol (VENTOLIN HFA) 108 (90 Base) MCG/ACT inhaler Inhale 1-2 puffs into the lungs every 6 (six) hours as needed for wheezing or shortness of breath. 1 each 0   escitalopram (LEXAPRO) 10 MG tablet Take 1 tablet (10 mg total) by mouth daily. 30 tablet 1   LO LOESTRIN FE 1 MG-10 MCG / 10 MCG tablet Take 1 tablet by mouth daily. (Patient not taking: Reported on 12/08/2021)     PTA Medications: No medications prior to admission.    Musculoskeletal: Strength & Muscle Tone: within normal limits Gait & Station: normal Patient leans: N/A            Psychiatric Specialty Exam:  Presentation  General Appearance: No data recorded Eye Contact:No data recorded Speech:No data recorded Speech Volume:No data recorded Handedness:No data recorded  Mood and Affect  Mood:No data recorded Affect:No data recorded  Thought Process  Thought Processes:No data recorded Duration of Psychotic Symptoms: No data recorded Past Diagnosis of Schizophrenia or Psychoactive disorder: No data recorded Descriptions of Associations:No data recorded Orientation:No data recorded Thought Content:No data recorded Hallucinations:No data recorded Ideas of Reference:No data recorded Suicidal Thoughts:No data recorded Homicidal Thoughts:No data recorded  Sensorium  Memory:No data recorded Judgment:No data recorded Insight:No data recorded  Executive Functions  Concentration:No data recorded Attention Span:No data recorded Recall:No data recorded Fund of Knowledge:No data recorded Language:No data recorded  Psychomotor Activity  Psychomotor Activity:No data recorded  Assets  Assets:No data recorded  Sleep  Sleep:No data recorded   Physical Exam: Physical  Exam Vitals and nursing note reviewed.  Constitutional:      Appearance: Normal appearance.  HENT:     Head: Normocephalic and atraumatic.     Mouth/Throat:     Pharynx: Oropharynx is clear.  Eyes:     Pupils: Pupils are equal, round, and reactive to light.  Cardiovascular:     Rate and Rhythm: Normal rate and regular rhythm.  Pulmonary:     Effort: Pulmonary effort is normal.     Breath sounds: Normal breath sounds.  Abdominal:     General: Abdomen is flat.     Palpations: Abdomen is soft.  Musculoskeletal:        General: Normal range of motion.  Skin:    General: Skin is warm and dry.  Neurological:     General: No focal deficit present.     Mental Status: She is alert. Mental status is at baseline.  Psychiatric:        Attention and Perception: Attention normal.        Mood and Affect: Mood is anxious and depressed.        Speech: Speech normal.        Behavior: Behavior is cooperative.        Thought Content: Thought content normal.        Cognition and Memory:  Cognition normal.   Review of Systems  Constitutional: Negative.   HENT: Negative.    Eyes: Negative.   Respiratory: Negative.    Cardiovascular: Negative.   Gastrointestinal: Negative.   Musculoskeletal: Negative.   Skin: Negative.   Neurological: Negative.   Psychiatric/Behavioral:  Positive for depression. Negative for hallucinations, substance abuse and suicidal ideas. The patient is nervous/anxious.   Blood pressure 103/68, pulse 84, temperature 98.3 F (36.8 C), temperature source Oral, resp. rate 18, height 5\' 3"  (1.6 m), weight 63.5 kg, last menstrual period 11/30/2021, SpO2 100 %. Body mass index is 24.8 kg/m.  Treatment Plan Summary: Medication management and Plan patient with depression and anxiety.  Reviewed treatment goals and diagnoses.  Suggest starting antidepressant medicine with serotonin reuptake inhibitor for treatment of anxiety as well.  Patient agreeable to plan.  Continue 15-minute  checks.  Reviewed labs.  Treatment team will meet with the patient and we will work on appropriate outpatient plans.  Observation Level/Precautions:  15 minute checks  Laboratory:  Chemistry Profile  Psychotherapy:    Medications:    Consultations:    Discharge Concerns:    Estimated LOS:  Other:     Physician Treatment Plan for Primary Diagnosis: Major depressive disorder, recurrent severe without psychotic features (HCC) Long Term Goal(s): Improvement in symptoms so as ready for discharge  Short Term Goals: Ability to disclose and discuss suicidal ideas  Physician Treatment Plan for Secondary Diagnosis: Principal Problem:   Major depressive disorder, recurrent severe without psychotic features (HCC)  Long Term Goal(s): Improvement in symptoms so as ready for discharge  Short Term Goals: Compliance with prescribed medications will improve  I certify that inpatient services furnished can reasonably be expected to improve the patient's condition.    12/02/2021, MD 5/26/20233:11 PM

## 2022-05-22 ENCOUNTER — Emergency Department
Admission: EM | Admit: 2022-05-22 | Discharge: 2022-05-23 | Disposition: A | Discharge status: Home / Self Care | Payer: Medicaid Other | Attending: Student in an Organized Health Care Education/Training Program | Admitting: Student in an Organized Health Care Education/Training Program

## 2022-05-22 ENCOUNTER — Other Ambulatory Visit: Payer: Self-pay

## 2022-05-22 ENCOUNTER — Encounter: Payer: Self-pay | Admitting: Emergency Medicine

## 2022-05-22 DIAGNOSIS — O3671X0 Maternal care for viable fetus in abdominal pregnancy, first trimester, not applicable or unspecified: Secondary | ICD-10-CM | POA: Diagnosis not present

## 2022-05-22 DIAGNOSIS — O219 Vomiting of pregnancy, unspecified: Secondary | ICD-10-CM | POA: Diagnosis present

## 2022-05-22 DIAGNOSIS — O208 Other hemorrhage in early pregnancy: Secondary | ICD-10-CM | POA: Insufficient documentation

## 2022-05-22 DIAGNOSIS — O2341 Unspecified infection of urinary tract in pregnancy, first trimester: Secondary | ICD-10-CM | POA: Diagnosis not present

## 2022-05-22 DIAGNOSIS — O210 Mild hyperemesis gravidarum: Secondary | ICD-10-CM | POA: Insufficient documentation

## 2022-05-22 DIAGNOSIS — Z3A01 Less than 8 weeks gestation of pregnancy: Secondary | ICD-10-CM | POA: Diagnosis not present

## 2022-05-22 LAB — COMPREHENSIVE METABOLIC PANEL
ALT: 11 U/L (ref 0–44)
AST: 17 U/L (ref 15–41)
Albumin: 4.5 g/dL (ref 3.5–5.0)
Alkaline Phosphatase: 52 U/L (ref 38–126)
Anion gap: 7 (ref 5–15)
BUN: 11 mg/dL (ref 6–20)
CO2: 23 mmol/L (ref 22–32)
Calcium: 9.5 mg/dL (ref 8.9–10.3)
Chloride: 107 mmol/L (ref 98–111)
Creatinine, Ser: 0.52 mg/dL (ref 0.44–1.00)
GFR, Estimated: >60 mL/min (ref >60)
Glucose, Bld: 83 mg/dL (ref 70–99)
Potassium: 3.7 mmol/L (ref 3.5–5.1)
Sodium: 137 mmol/L (ref 135–145)
Total Bilirubin: 0.9 mg/dL (ref 0.3–1.2)
Total Protein: 8.1 g/dL (ref 6.5–8.1)

## 2022-05-22 LAB — URINALYSIS, ROUTINE W REFLEX MICROSCOPIC
Bilirubin Urine: NEGATIVE
Glucose, UA: NEGATIVE mg/dL
Hgb urine dipstick: NEGATIVE
Ketones, ur: 80 mg/dL — AB
Nitrite: NEGATIVE
Protein, ur: 30 mg/dL — AB
Specific Gravity, Urine: 1.033 — ABNORMAL HIGH (ref 1.005–1.030)
pH: 5 (ref 5.0–8.0)

## 2022-05-22 LAB — CBC WITH DIFFERENTIAL/PLATELET
Abs Immature Granulocytes: 0.04 10*3/uL (ref 0.00–0.07)
Basophils Absolute: 0.1 10*3/uL (ref 0.0–0.1)
Basophils Relative: 1 %
Eosinophils Absolute: 0 10*3/uL (ref 0.0–0.5)
Eosinophils Relative: 0 %
HCT: 41 % (ref 36.0–46.0)
Hemoglobin: 14.4 g/dL (ref 12.0–15.0)
Immature Granulocytes: 0 %
Lymphocytes Relative: 17 %
Lymphs Abs: 1.8 10*3/uL (ref 0.7–4.0)
MCH: 31.3 pg (ref 26.0–34.0)
MCHC: 35.1 g/dL (ref 30.0–36.0)
MCV: 89.1 fL (ref 80.0–100.0)
Monocytes Absolute: 0.6 10*3/uL (ref 0.1–1.0)
Monocytes Relative: 6 %
Neutro Abs: 7.9 10*3/uL — ABNORMAL HIGH (ref 1.7–7.7)
Neutrophils Relative %: 76 %
Platelets: 262 10*3/uL (ref 150–400)
RBC: 4.6 MIL/uL (ref 3.87–5.11)
RDW: 11.9 % (ref 11.5–15.5)
WBC: 10.5 10*3/uL (ref 4.0–10.5)
nRBC: 0 % (ref 0.0–0.2)

## 2022-05-22 LAB — HCG, QUANTITATIVE, PREGNANCY: hCG, Beta Chain, Quant, S: 120894 m[IU]/mL — ABNORMAL HIGH (ref <5)

## 2022-05-22 LAB — LIPASE, BLOOD: Lipase: 31 U/L (ref 11–51)

## 2022-05-22 LAB — POC URINE PREG, ED: Preg Test, Ur: POSITIVE — AB

## 2022-05-22 MED ORDER — ONDANSETRON 4 MG PO TBDP: 4.0000 mg | ORAL_TABLET | Freq: Three times a day (TID) | ORAL | 0 refills | Status: DC | PRN

## 2022-05-22 MED ORDER — ONDANSETRON 4 MG PO TBDP
4.0000 mg | ORAL_TABLET | Freq: Once | ORAL | Status: AC
  Administered 2022-05-22: 4 mg via ORAL
  Filled 2022-05-22: qty 1

## 2022-05-22 MED ORDER — CEPHALEXIN 500 MG PO CAPS: 500.0000 mg | ORAL_CAPSULE | Freq: Three times a day (TID) | ORAL | 0 refills | Status: AC

## 2022-05-22 MED ORDER — CEPHALEXIN 500 MG PO CAPS
500.0000 mg | ORAL_CAPSULE | Freq: Once | ORAL | Status: AC
  Administered 2022-05-23: 500 mg via ORAL
  Filled 2022-05-22: qty 1

## 2022-05-22 MED ORDER — METOCLOPRAMIDE HCL 5 MG/ML IJ SOLN
10.0000 mg | Freq: Once | INTRAMUSCULAR | Status: AC
  Administered 2022-05-22: 10 mg via INTRAVENOUS
  Filled 2022-05-22: qty 2

## 2022-05-22 MED ORDER — SODIUM CHLORIDE 0.9 % IV BOLUS
1000.0000 mL | Freq: Once | INTRAVENOUS | Status: AC
  Administered 2022-05-22: 1000 mL via INTRAVENOUS

## 2022-05-22 NOTE — ED Provider Triage Note (Signed)
Emergency Medicine Provider Triage Evaluation Note  Haze Boyden , a 21 y.o. female  was evaluated in triage.  Pt complains of nausea and vomiting.  [redacted] weeks pregnant been unable to keep any fluids down.  No fever or chills.  Review of Systems  Positive: Negative:   Physical Exam  BP 113/72 (BP Location: Left Arm)   Pulse (!) 101   Temp 98.7 F (37.1 C) (Oral)   Resp 18   SpO2 92%  Gen:   Awake, no distress   Resp:  Normal effort  MSK:   Moves extremities without difficulty  Other:    Medical Decision Making  Medically screening exam initiated at 6:20 PM.  Appropriate orders placed.  LUJUANA KAPLER was informed that the remainder of the evaluation will be completed by another provider, this initial triage assessment does not replace that evaluation, and the importance of remaining in the ED until their evaluation is complete.  Zofran ODT given in triage.  Labs and urinalysis   Versie Starks, PA-C 05/22/22 1820

## 2022-05-22 NOTE — ED Triage Notes (Signed)
Patient to ED c/o N/V. Patient is currently [redacted] weeks pregnant. Has not seen OB yet. Unable to keep anything down for the past week. Patient upset and crying in triage. Denies any pain.

## 2022-05-22 NOTE — ED Provider Notes (Signed)
[facility] Emergency Department Provider Note     Event Date/Time   First MD Initiated Contact with Patient 05/22/22 2116     (approximate)   History   Morning Sickness   HPI  Kelly Ponce is a 21 y.o. G1P0 female with a history of depression and hypomagnesemia presents to the ED at [redacted] weeks gestation.  Patient reports significant nausea and vomiting for the last few days.  She any fevers, abdominal pain, or abnormal vaginal bleeding.  She has not established care, but intends to seek care at [facility].      Physical Exam   Triage Vital Signs: ED Triage Vitals  Enc Vitals Group     BP 05/22/22 1818 113/72     Pulse Rate 05/22/22 1818 (!) 101     Resp 05/22/22 1818 18     Temp 05/22/22 1818 98.7 F (37.1 C)     Temp Source 05/22/22 1818 Oral     SpO2 05/22/22 1818 92 %     Weight --      Height --      Head Circumference --      Peak Flow --      Pain Score 05/22/22 1817 0     Pain Loc --      Pain Edu? --      Excl. in GC? --     Most recent vital signs: Vitals:   05/22/22 1818  BP: 113/72  Pulse: (!) 101  Resp: 18  Temp: 98.7 F (37.1 C)  SpO2: 92%    General Awake, no distress. NAD HEENT NCAT. PERRL. EOMI. No rhinorrhea. Mucous membranes are moist.  CV:  Good peripheral perfusion.  RESP:  Normal effort.  ABD:  No distention. Soft, nontender   ED Results / Procedures / Treatments   Labs (all labs ordered are listed, but only abnormal results are displayed) Labs Reviewed  CBC WITH DIFFERENTIAL/PLATELET - Abnormal; Notable for the following components:      Result Value   Neutro Abs 7.9 (*)    All other components within normal limits  URINALYSIS, ROUTINE W REFLEX MICROSCOPIC - Abnormal; Notable for the following components:   Color, Urine AMBER (*)    APPearance HAZY (*)    Specific Gravity, Urine 1.033 (*)    Ketones, ur 80 (*)    Protein, ur 30 (*)    Leukocytes,Ua SMALL (*)    Bacteria, UA MANY (*)    All  other components within normal limits  HCG, QUANTITATIVE, PREGNANCY - Abnormal; Notable for the following components:   hCG, Beta Chain, Quant, S 272,536 (*)    All other components within normal limits  POC URINE PREG, ED - Abnormal; Notable for the following components:   Preg Test, Ur POSITIVE (*)    All other components within normal limits  URINE CULTURE  COMPREHENSIVE METABOLIC PANEL  LIPASE, BLOOD     EKG   RADIOLOGY   No results found.   PROCEDURES:  Critical Care performed: No  Procedures   MEDICATIONS ORDERED IN ED: Medications  cephALEXin (KEFLEX) capsule 500 mg (has no administration in time range)  ondansetron (ZOFRAN-ODT) disintegrating tablet 4 mg (4 mg Oral Given 05/22/22 1824)  sodium chloride 0.9 % bolus 1,000 mL (1,000 mLs Intravenous New Bag/Given 05/22/22 2246)  metoCLOPramide (REGLAN) injection 10 mg (10 mg Intravenous Given 05/22/22 2247)     IMPRESSION / MDM / ASSESSMENT AND PLAN / ED COURSE  I reviewed  the triage vital signs and the nursing notes.                              Differential diagnosis includes, but is not limited to, ovarian cyst, ovarian torsion, acute appendicitis, diverticulitis, urinary tract infection/pyelonephritis, endometriosis, bowel obstruction, colitis, renal colic, gastroenteritis, hernia, fibroids, endometriosis, pregnancy related pain including ectopic pregnancy, etc.  Patient's presentation is most consistent with acute presentation with potential threat to life or bodily function.  Female patient G1, P0 presents with some hyperemesis as well as some recent onset of some abdominal discomfort.  Patient is evaluated for complaints in ED, found to have a reassuring work-up overall.  No signs of any critical anemia or acute leukocytosis.  She does have UA evidence of UTI with nitrite positive urine and bacteriuria appreciated.  Patient without any pregnancy related complaints like pelvic pain, vaginal discharge, or vaginal  bleeding.  Patient's diagnosis is consistent with hyperemesis and UTI and first trimester pregnancy.  Patient will be discharged home with prescriptions for Zofran and Keflex patient is to follow up with PCP or [facility] OBG provider as needed or otherwise directed. Patient is given ED precautions to return to the ED for any worsening or new symptoms.  Clinical Course as of 05/22/22 2300  Fri May 22, 2022  2213 Urine Culture [JM]    Clinical Course User Index [JM] Aadhav Uhlig, Dannielle Karvonen, PA-C    FINAL CLINICAL IMPRESSION(S) / ED DIAGNOSES   Final diagnoses:  Hyperemesis gravidarum  Urinary tract infection in mother during first trimester of pregnancy     Rx / DC Orders   ED Discharge Orders          Ordered    cephALEXin (KEFLEX) 500 MG capsule  3 times daily        05/22/22 2250    ondansetron (ZOFRAN-ODT) 4 MG disintegrating tablet  Every 8 hours PRN        05/22/22 2250             Note:  This document was prepared using Dragon voice recognition software and may include unintentional dictation errors.    Melvenia Needles, PA-C 05/22/22 2300    Merlyn Lot, MD 05/22/22 2326

## 2022-05-22 NOTE — Discharge Instructions (Addendum)
Your exam and labs overall reassuring.  You do have evidence of a UTI which will be treated with Keflex.  Continue to hydrate and empty her bladder on schedule.  Take the antibiotic as prescribed until all pills are gone.  Follow-up with your primary provider or OB provider for ongoing symptoms peer return to the ED if necessary.

## 2022-05-23 ENCOUNTER — Emergency Department
Admission: EM | Admit: 2022-05-23 | Discharge: 2022-05-23 | Disposition: A | Discharge status: Home / Self Care | Payer: Medicaid Other | Source: Home / Self Care | Attending: Emergency Medicine | Admitting: Emergency Medicine

## 2022-05-23 ENCOUNTER — Telehealth: Payer: Self-pay | Admitting: Physician Assistant

## 2022-05-23 ENCOUNTER — Encounter: Payer: Self-pay | Admitting: Emergency Medicine

## 2022-05-23 ENCOUNTER — Emergency Department: Payer: Medicaid Other

## 2022-05-23 ENCOUNTER — Other Ambulatory Visit: Payer: Self-pay

## 2022-05-23 DIAGNOSIS — O468X1 Other antepartum hemorrhage, first trimester: Secondary | ICD-10-CM

## 2022-05-23 DIAGNOSIS — O208 Other hemorrhage in early pregnancy: Secondary | ICD-10-CM | POA: Insufficient documentation

## 2022-05-23 DIAGNOSIS — O3671X0 Maternal care for viable fetus in abdominal pregnancy, first trimester, not applicable or unspecified: Secondary | ICD-10-CM | POA: Insufficient documentation

## 2022-05-23 DIAGNOSIS — Z3491 Encounter for supervision of normal pregnancy, unspecified, first trimester: Secondary | ICD-10-CM

## 2022-05-23 DIAGNOSIS — Z3A01 Less than 8 weeks gestation of pregnancy: Secondary | ICD-10-CM | POA: Insufficient documentation

## 2022-05-23 LAB — TSH: TSH: 0.356 u[IU]/mL (ref 0.350–4.500)

## 2022-05-23 LAB — HCG, QUANTITATIVE, PREGNANCY: hCG, Beta Chain, Quant, S: 133110 m[IU]/mL — ABNORMAL HIGH (ref <5)

## 2022-05-23 NOTE — ED Notes (Signed)
Patient declined discharge vital signs. 

## 2022-05-23 NOTE — ED Triage Notes (Signed)
Pt via POV from home. Pt was told to come back for repeat lab testing and a [place]. Pt is A&Ox4 and NAD

## 2022-05-23 NOTE — Telephone Encounter (Cosign Needed)
  I called the patient for a post-visit recheck. She reports improved symptoms with anti-emetic. She denies pelvic pain, bleeding, or dysuria. I notified patient of the need to evaluate her with an [place] due to elevated hcg in early pregnancy (>100K iu). Patient will return to the ED for repeat labs and [place] for r/o multiple gestation versus GTN

## 2022-05-23 NOTE — Discharge Instructions (Addendum)
Your exam, labs, and ultrasound are all normal and reassuring at this time.  You have a normal-appearing single pregnancy with a small subchorionic hemorrhage.  This can sometimes cause some bleeding and early pregnancy.  You should follow-up with your primary OB provider as planned.  Return to the ED if needed.  Continue with previously prescribed medications as discussed.

## 2022-05-23 NOTE — ED Provider Notes (Signed)
[facility] Emergency Department Provider Note     Event Date/Time   First MD Initiated Contact with Patient 05/23/22 1555     (approximate)   History   Abnormal Lab   HPI  Kelly Ponce is a 21 y.o. female G1, P0, returns to the ED for evaluation with ultrasound.  Patient was evaluated in the ED yesterday, with initial reports of a [redacted] weeks gestation of pregnancy.  She had an elevated beta quant of greater than 120,000 at that time.  Patient was also found to have evidence of a UTI and some hyperemesis.  She was treated with IV fluids initial dose of antibiotic given in ED.  She presents today with symptoms improved related to nausea and vomiting.  Spoke to the patient regarding her lab results, relating some concern for an IUP versus gestational trophoblastic disease.  She presents to the ED with no complaints, denies any abnormal vaginal bleeding or pelvic pain at this time.  Scheduled to see her OB provider in 3 weeks at [facility] for initial OB evaluation.   Physical Exam   Triage Vital Signs: ED Triage Vitals  Enc Vitals Group     BP 05/23/22 1605 109/70     Pulse Rate 05/23/22 1605 86     Resp 05/23/22 1605 18     Temp 05/23/22 1605 98.8 F (37.1 C)     Temp Source 05/23/22 1605 Oral     SpO2 05/23/22 1605 100 %     Weight 05/23/22 1605 139 lb 15.9 oz (63.5 kg)     Height 05/23/22 1605 5\' 3"  (1.6 m)     Head Circumference --      Peak Flow --      Pain Score 05/23/22 1557 0     Pain Loc --      Pain Edu? --      Excl. in [place]? --     Most recent vital signs: Vitals:   05/23/22 1605  BP: 109/70  Pulse: 86  Resp: 18  Temp: 98.8 F (37.1 C)  SpO2: 100%    General Awake, no distress. NAD CV:  Good peripheral perfusion.  RESP:  Normal effort.  ABD:  No distention. Soft, nontender GU:  deferred  ED Results / Procedures / Treatments   Labs (all labs ordered are listed, but only abnormal results are displayed) Labs Reviewed   HCG, QUANTITATIVE, PREGNANCY - Abnormal; Notable for the following components:      Result Value   hCG, Beta Chain, Quant, S 133,110 (*)    All other components within normal limits  TSH     EKG   RADIOLOGY  I personally viewed and evaluated these images as part of my medical decision making, as well as reviewing the written report by the radiologist.  ED Provider Interpretation: single IUP  13/04/23 OB LESS THAN 14 WEEKS WITH OB TRANSVAGINAL  Result Date: 05/23/2022 CLINICAL DATA:  Pelvic pain since yesterday. Seven weeks and 1 day pregnant by last menstrual. Quantitative beta HCG 133,110 EXAM: OBSTETRIC <14 WK 13/10/2021 AND TRANSVAGINAL OB US TECHNIQUE: Both transabdominal and transvaginal ultrasound examinations were performed for complete evaluation of the gestation as well as the maternal uterus, adnexal regions, and pelvic cul-de-sac. Transvaginal technique was performed to assess early pregnancy. COMPARISON:  None Available. FINDINGS: Intrauterine gestational sac: Visualized Yolk sac:  Visualized Embryo:  Visualized Cardiac Activity: Visualized Heart Rate: 169 bpm CRL:  14.4 mm   7 w  5 d                  [place] EDC: 01/04/2023 Subchorionic hemorrhage:  Small Maternal uterus/adnexae: Normal appearing maternal ovaries. No free peritoneal fluid. IMPRESSION: 1. Single live intrauterine gestation with an estimated gestational age of [redacted] weeks and 5 days. 2. Small subchorionic hemorrhage. Electronically Signed   By: Claudie Revering M.D.   On: 05/23/2022 18:30     PROCEDURES:  Critical Care performed: No  Procedures   MEDICATIONS ORDERED IN ED: Medications - No data to display   IMPRESSION / MDM / [place] / ED COURSE  I reviewed the triage vital signs and the nursing notes.                              Differential diagnosis includes, but is not limited to, threatened miscarriage, incomplete miscarriage, normal bleeding from an early trimester pregnancy, ectopic pregnancy, , blighted  ovum, vaginal/cervical trauma, subchorionic hemorrhage/hematoma, etc.  Patient's presentation is most consistent with acute complicated illness / injury requiring diagnostic workup.  Female patient to the ED for evaluation of elevated beta quant and early pregnancy.  Concern for possible multiple gestation versus gestation trophoblastic disease.  Patient was further evaluated with ultrasound which did confirm a single viable IUP with a small subchorionic hemorrhage.  Patient's diagnosis is consistent with IUP at 7 weeks and 4 days.  Patient will be discharged home with directions to complete antibiotics previously prescribed as well as the nausea medicine as needed.Patient is to follow up with her OB provider as needed or otherwise directed. Patient is given ED precautions to return to the ED for any worsening or new symptoms.     FINAL CLINICAL IMPRESSION(S) / ED DIAGNOSES   Final diagnoses:  Normal IUP (intrauterine pregnancy) on prenatal ultrasound, first trimester  Subchorionic hematoma in first trimester, single or unspecified fetus     Rx / DC Orders   ED Discharge Orders     None        Note:  This document was prepared using Dragon voice recognition software and may include unintentional dictation errors.    Melvenia Needles, PA-C 05/23/22 2336    Lavonia Drafts, MD 05/25/22 0700

## 2022-05-25 LAB — URINE CULTURE: Culture: >=100000 — AB

## 2022-07-19 ENCOUNTER — Encounter: Payer: Self-pay | Admitting: Emergency Medicine

## 2022-07-19 ENCOUNTER — Ambulatory Visit
Admission: EM | Admit: 2022-07-19 | Discharge: 2022-07-19 | Disposition: A | Discharge status: Home / Self Care | Payer: Medicaid Other | Attending: Emergency Medicine | Admitting: Emergency Medicine

## 2022-07-19 DIAGNOSIS — Z1152 Encounter for screening for COVID-19: Secondary | ICD-10-CM | POA: Insufficient documentation

## 2022-07-19 DIAGNOSIS — O99512 Diseases of the respiratory system complicating pregnancy, second trimester: Secondary | ICD-10-CM | POA: Diagnosis not present

## 2022-07-19 DIAGNOSIS — Z3A15 15 weeks gestation of pregnancy: Secondary | ICD-10-CM | POA: Diagnosis not present

## 2022-07-19 DIAGNOSIS — J09X2 Influenza due to identified novel influenza A virus with other respiratory manifestations: Secondary | ICD-10-CM | POA: Diagnosis not present

## 2022-07-19 DIAGNOSIS — O26892 Other specified pregnancy related conditions, second trimester: Secondary | ICD-10-CM | POA: Diagnosis present

## 2022-07-19 LAB — RESP PANEL BY RT-PCR (RSV, FLU A&B, COVID)  RVPGX2
Influenza A by PCR: POSITIVE — AB
Influenza B by PCR: NEGATIVE
Resp Syncytial Virus by PCR: NEGATIVE
SARS Coronavirus 2 by RT PCR: NEGATIVE

## 2022-07-19 MED ORDER — FLUTICASONE PROPIONATE 50 MCG/ACT NA SUSP: 2.0000 | Freq: Every day | NASAL | 1 refills | Status: DC

## 2022-07-19 MED ORDER — OSELTAMIVIR PHOSPHATE 75 MG PO CAPS: 75.0000 mg | ORAL_CAPSULE | Freq: Two times a day (BID) | ORAL | 0 refills | Status: DC

## 2022-07-19 MED ORDER — PROMETHAZINE-DM 6.25-15 MG/5ML PO SYRP: 5.0000 mL | ORAL_SOLUTION | Freq: Four times a day (QID) | ORAL | 0 refills | Status: DC | PRN

## 2022-07-19 NOTE — Discharge Instructions (Addendum)
You have tested positive for influenza A today.  Take the Tamiflu twice daily for 5 days for treatment of influenza.  Continue to use over-the-counter Tylenol as needed for body aches or fever.  Use the Flonase, 2 squirts up each nostril at bedtime, to help with nasal congestion.  You may also use saline nasal rinses to help with congestion.  Continue to use your Zofran as needed for nausea.  It is important that you are able to take in fluids and some food if possible for the health of you and your baby.  Use the Promethazine DM cough syrup at bedtime only.  This medication will make you sleepy but will help dry up your nasal drainage and help you get some sleep.  I will also help you with nausea.  If you develop any new or worsening symptoms either return for reevaluation or seek care in the emergency department.

## 2022-07-19 NOTE — ED Notes (Signed)
Fetal heart rate 160 bpm

## 2022-07-19 NOTE — ED Triage Notes (Addendum)
Pt presents with a cough, runny nose, headache, nausea and abdominal pain x 4 days. Pt is [redacted] weeks pregnant.

## 2022-07-19 NOTE — ED Provider Notes (Signed)
[facility]    CSN: [id number] Arrival date & time: 07/19/22  1233      History   Chief Complaint Chief Complaint  Patient presents with   Nasal Congestion   Headache   Abdominal Pain   Nausea   Cough    HPI Kelly Ponce is a 21 y.o. female.   HPI  21 year old female here for evaluation of flulike symptoms.  The patient is here for evaluation 4-day history of subjective fever, runny nose with clear nasal discharge, headache, nonproductive cough, nausea and vomiting, and left lower quadrant abdominal pain.  She states that she has been able to take down fluids and has been using Zofran.  She is also felt her baby move since the the illness developed.  She has not had any vaginal bleeding and she denies shortness of breath or wheezing.  She is currently [redacted] weeks pregnant.  History reviewed. No pertinent past medical history.  Patient Active Problem List   Diagnosis Date Noted   Major depressive disorder, recurrent severe without psychotic features ([place]) 12/08/2021   Hypomagnesemia 09/12/2021   SOB (shortness of breath) 09/12/2021   Acute bronchitis with bronchospasm 09/11/2021    History reviewed. No pertinent surgical history.  OB History     Gravida  1   Para      Term      Preterm      AB      Living         SAB      IAB      Ectopic      Multiple      Live Births               Home Medications    Prior to Admission medications   Medication Sig Start Date End Date Taking? Authorizing Provider  fluticasone (FLONASE) 50 MCG/ACT nasal spray Place 2 sprays into both nostrils daily. 07/19/22  Yes Margarette Canada, NP  oseltamivir (TAMIFLU) 75 MG capsule Take 1 capsule (75 mg total) by mouth every 12 (twelve) hours. 07/19/22  Yes Margarette Canada, NP  promethazine-dextromethorphan (PROMETHAZINE-DM) 6.25-15 MG/5ML syrup Take 5 mLs by mouth 4 (four) times daily as needed. 07/19/22  Yes Margarette Canada, NP  albuterol (VENTOLIN HFA) 108 (90  Base) MCG/ACT inhaler Inhale 1-2 puffs into the lungs every 6 (six) hours as needed for wheezing or shortness of breath. 09/12/21   [place] Eric J, DO  escitalopram (LEXAPRO) 10 MG tablet Take 1 tablet (10 mg total) by mouth daily. 12/11/21   Clapacs, Madie Reno, MD  ondansetron (ZOFRAN-ODT) 4 MG disintegrating tablet Take 1 tablet (4 mg total) by mouth every 8 (eight) hours as needed for nausea or vomiting. 05/22/22   Menshew, Dannielle Karvonen, PA-C    Family History History reviewed. No pertinent family history.  Social History Social History   Tobacco Use   Smoking status: Never   Smokeless tobacco: Never  Vaping Use   Vaping Use: Every day  Substance Use Topics   Alcohol use: Yes    Alcohol/week: 4.0 standard drinks of alcohol    Types: 4 Cans of beer per week   Drug use: Yes    Types: Marijuana     Allergies   Patient has no known allergies.   Review of Systems Review of Systems  Constitutional:  Positive for fever.  HENT:  Positive for congestion and rhinorrhea.   Respiratory:  Positive for cough. Negative for shortness of breath and wheezing.  Gastrointestinal:  Positive for abdominal pain, nausea and vomiting. Negative for diarrhea.  Neurological:  Positive for headaches.     Physical Exam Triage Vital Signs ED Triage Vitals  Enc Vitals Group     BP 07/19/22 1348 107/64     Pulse Rate 07/19/22 1348 (!) 105     Resp 07/19/22 1348 18     Temp 07/19/22 1348 98.6 F (37 C)     Temp Source 07/19/22 1348 Oral     SpO2 07/19/22 1348 98 %     Weight --      Height --      Head Circumference --      Peak Flow --      Pain Score 07/19/22 1346 5     Pain Loc --      Pain Edu? --      Excl. in [facility]? --    No data found.  Updated Vital Signs BP 107/64 (BP Location: Right Arm)   Pulse (!) 105   Temp 98.6 F (37 C) (Oral)   Resp 18   LMP 11/30/2021 (Exact Date)   SpO2 98%   Visual Acuity Right Eye Distance:   Left Eye Distance:   Bilateral Distance:    Right  Eye Near:   Left Eye Near:    Bilateral Near:     Physical Exam Vitals and nursing note reviewed.  Constitutional:      Appearance: Normal appearance. She is not ill-appearing.  HENT:     Head: Normocephalic and atraumatic.     Right Ear: Tympanic membrane, ear canal and external ear normal. There is no impacted cerumen.     Left Ear: Tympanic membrane, ear canal and external ear normal. There is no impacted cerumen.     Nose: Congestion and rhinorrhea present.     Comments: Nasal mucosa is erythematous and edematous with clear discharge in both nares.    Mouth/Throat:     Mouth: Mucous membranes are moist.     Pharynx: Oropharynx is clear. Posterior oropharyngeal erythema present.     Comments: Patient's posterior oropharynx has erythema and injection with clear postnasal drip.  Tonsillar pillars are benign. Cardiovascular:     Rate and Rhythm: Normal rate and regular rhythm.     Pulses: Normal pulses.     Heart sounds: Normal heart sounds. No murmur heard.    No friction rub. No gallop.  Pulmonary:     Effort: Pulmonary effort is normal.     Breath sounds: Normal breath sounds. No wheezing, rhonchi or rales.  Abdominal:     General: Abdomen is flat.     Palpations: Abdomen is soft.     Tenderness: There is abdominal tenderness. There is no guarding or rebound.  Musculoskeletal:     Cervical back: Normal range of motion and neck supple.  Lymphadenopathy:     Cervical: No cervical adenopathy.  Skin:    General: Skin is warm and dry.     Capillary Refill: Capillary refill takes less than 2 seconds.     Findings: No rash.  Neurological:     General: No focal deficit present.     Mental Status: She is alert and oriented to person, place, and time.  Psychiatric:        Mood and Affect: Mood normal.        Behavior: Behavior normal.        Thought Content: Thought content normal.        Judgment: Judgment normal.  UC Treatments / Results  Labs (all labs ordered are  listed, but only abnormal results are displayed) Labs Reviewed  RESP PANEL BY RT-PCR (RSV, FLU A&B, COVID)  RVPGX2 - Abnormal; Notable for the following components:      Result Value   Influenza A by PCR POSITIVE (*)    All other components within normal limits    EKG   Radiology No results found.  Procedures Procedures (including critical care time)  Medications Ordered in UC Medications - No data to display  Initial Impression / Assessment and Plan / UC Course  I have reviewed the triage vital signs and the nursing notes.  Pertinent labs & imaging results that were available during my care of the patient were reviewed by me and considered in my medical decision making (see chart for details).   Patient is a nontoxic-appearing 21 year old female here for evaluation of flulike symptoms that been going on for the past 4 days as outlined in HPI above.  She is currently [redacted] weeks pregnant.  She states that she has felt the baby move since she has been ill and that she has been able to take and keep down fluids using Zofran that has been previously prescribed to her.  She is not in any acute distress though she does look like she does not feel well.  There is inflammation of her nasal mucosa with clear nasal discharge and redness to the posterior oropharynx with clear postnasal drip.  No cervical lymphadenopathy on exam and her cardiopulmonary exam bisque lung sounds in all fields.  Her abdomen is soft with generalized upper and left lower quadrant abdominal tenderness.  No guarding or rebound.  Respiratory panel was collected at triage and is pending.  I will also order fetal heart tone examination.  Fetal heart tones are 160.  Respiratory panel is positive for influenza A.  I will discharge patient home on Tamiflu 75 mg twice daily for 5 days.  I will have her continue to use the Zofran as needed for nausea and vomiting.  Push fluids, Tylenol as needed for pain and fever.  I will  prescribe Flonase that she can use for her nasal congestion as well as Promethazine DM to help with cough and congestion.   Final Clinical Impressions(s) / UC Diagnoses   Final diagnoses:  Influenza due to identified novel influenza A virus with other respiratory manifestations     Discharge Instructions      You have tested positive for influenza A today.  Take the Tamiflu twice daily for 5 days for treatment of influenza.  Continue to use over-the-counter Tylenol as needed for body aches or fever.  Use the Flonase, 2 squirts up each nostril at bedtime, to help with nasal congestion.  You may also use saline nasal rinses to help with congestion.  Continue to use your Zofran as needed for nausea.  It is important that you are able to take in fluids and some food if possible for the health of you and your baby.  Use the Promethazine DM cough syrup at bedtime only.  This medication will make you sleepy but will help dry up your nasal drainage and help you get some sleep.  I will also help you with nausea.  If you develop any new or worsening symptoms either return for reevaluation or seek care in the emergency department.     ED Prescriptions     Medication Sig Dispense Auth. Provider   oseltamivir (TAMIFLU) 75 MG  capsule Take 1 capsule (75 mg total) by mouth every 12 (twelve) hours. 10 capsule Becky Augusta, NP   fluticasone (FLONASE) 50 MCG/ACT nasal spray Place 2 sprays into both nostrils daily. 18.2 mL Becky Augusta, NP   promethazine-dextromethorphan (PROMETHAZINE-DM) 6.25-15 MG/5ML syrup Take 5 mLs by mouth 4 (four) times daily as needed. 118 mL Becky Augusta, NP      PDMP not reviewed this encounter.   Becky Augusta, NP 07/19/22 1439

## 2022-07-27 DIAGNOSIS — Z3A16 16 weeks gestation of pregnancy: Secondary | ICD-10-CM | POA: Diagnosis not present

## 2022-07-27 DIAGNOSIS — Z3492 Encounter for supervision of normal pregnancy, unspecified, second trimester: Secondary | ICD-10-CM | POA: Diagnosis not present

## 2022-07-27 DIAGNOSIS — Z1332 Encounter for screening for maternal depression: Secondary | ICD-10-CM | POA: Diagnosis not present

## 2022-08-26 DIAGNOSIS — Z3A21 21 weeks gestation of pregnancy: Secondary | ICD-10-CM | POA: Diagnosis not present

## 2022-08-26 DIAGNOSIS — Z3689 Encounter for other specified antenatal screening: Secondary | ICD-10-CM | POA: Diagnosis not present

## 2022-09-16 IMAGING — CR DG CHEST 2V
2 series · 2 of 2 positions shown · non-contrast
Comparison: None.

CLINICAL DATA: Chest pain, centralized nonradiating

EXAM:
CHEST - 2 VIEW

[chest pa]
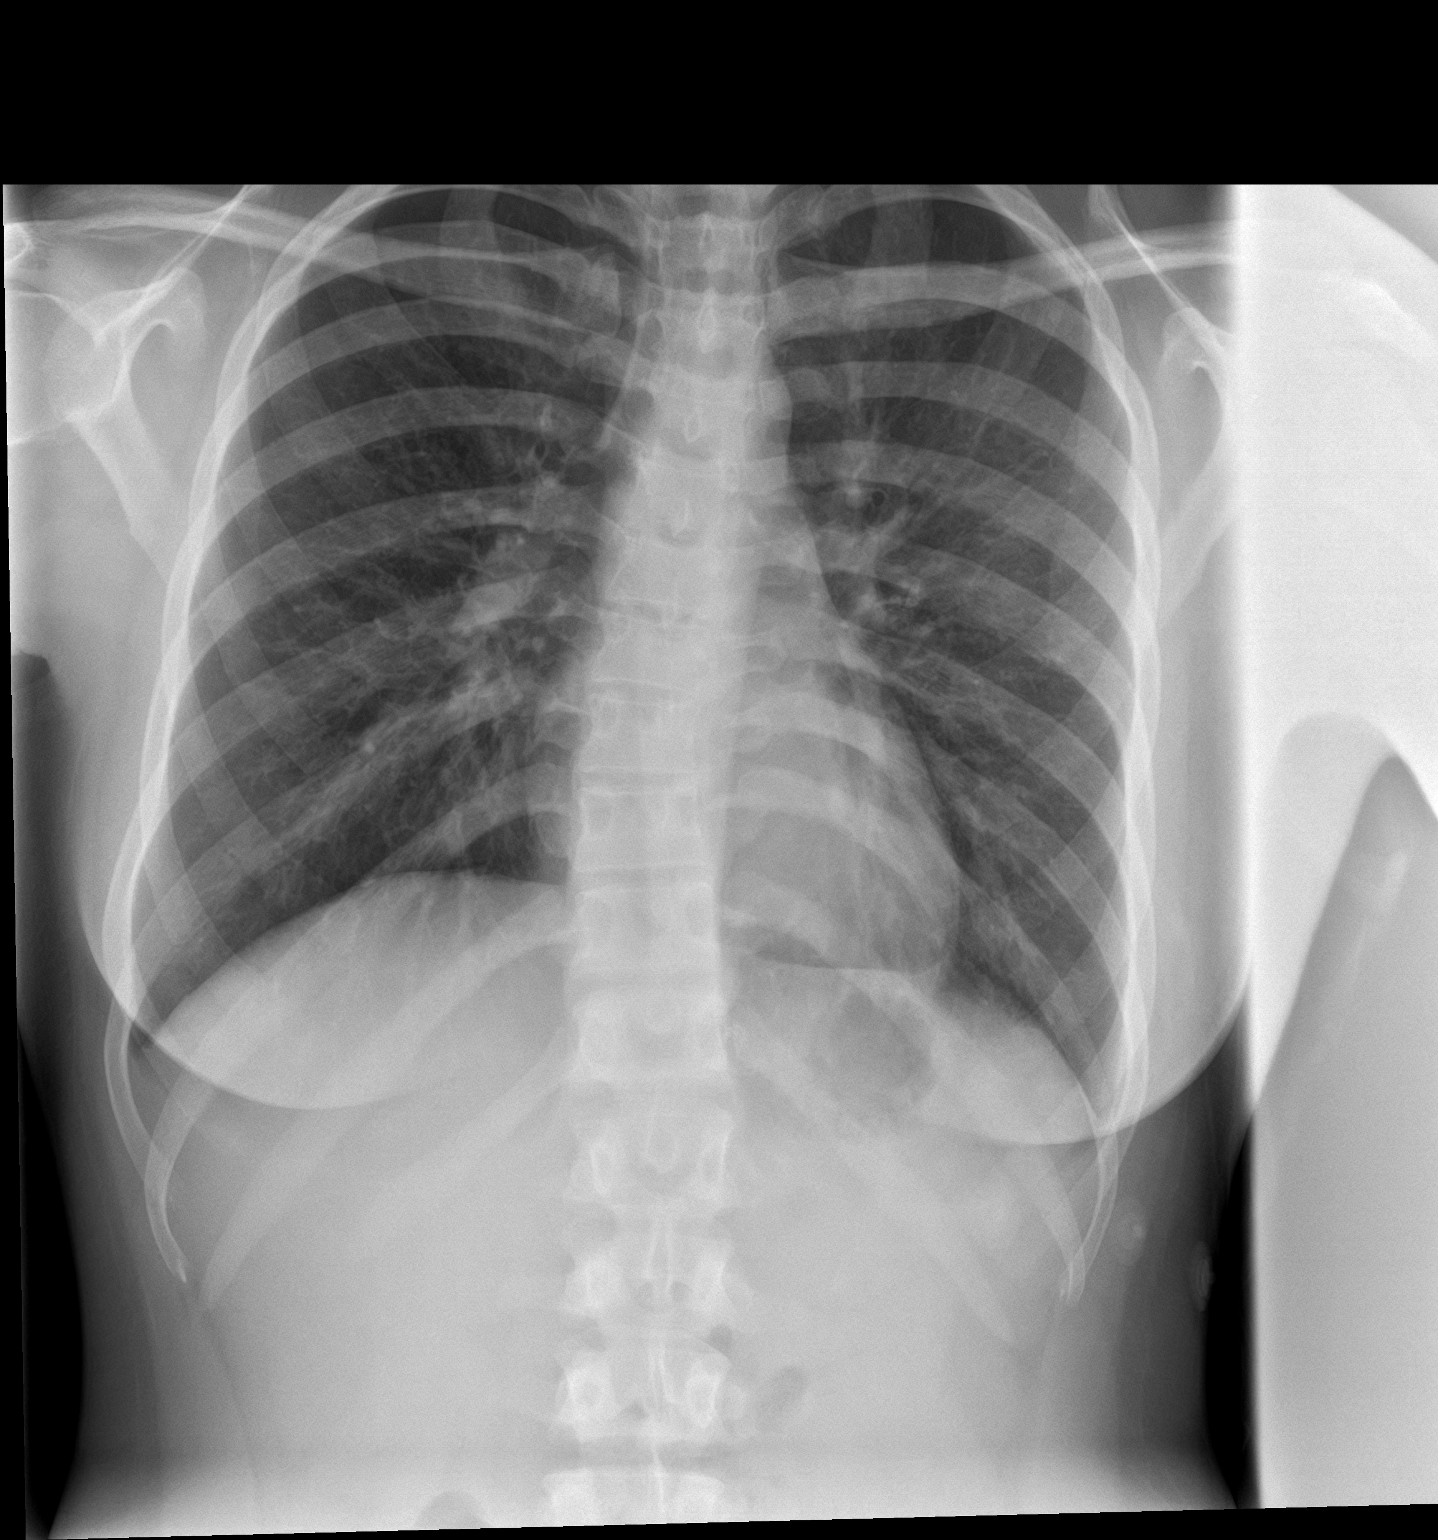

[chest lat]
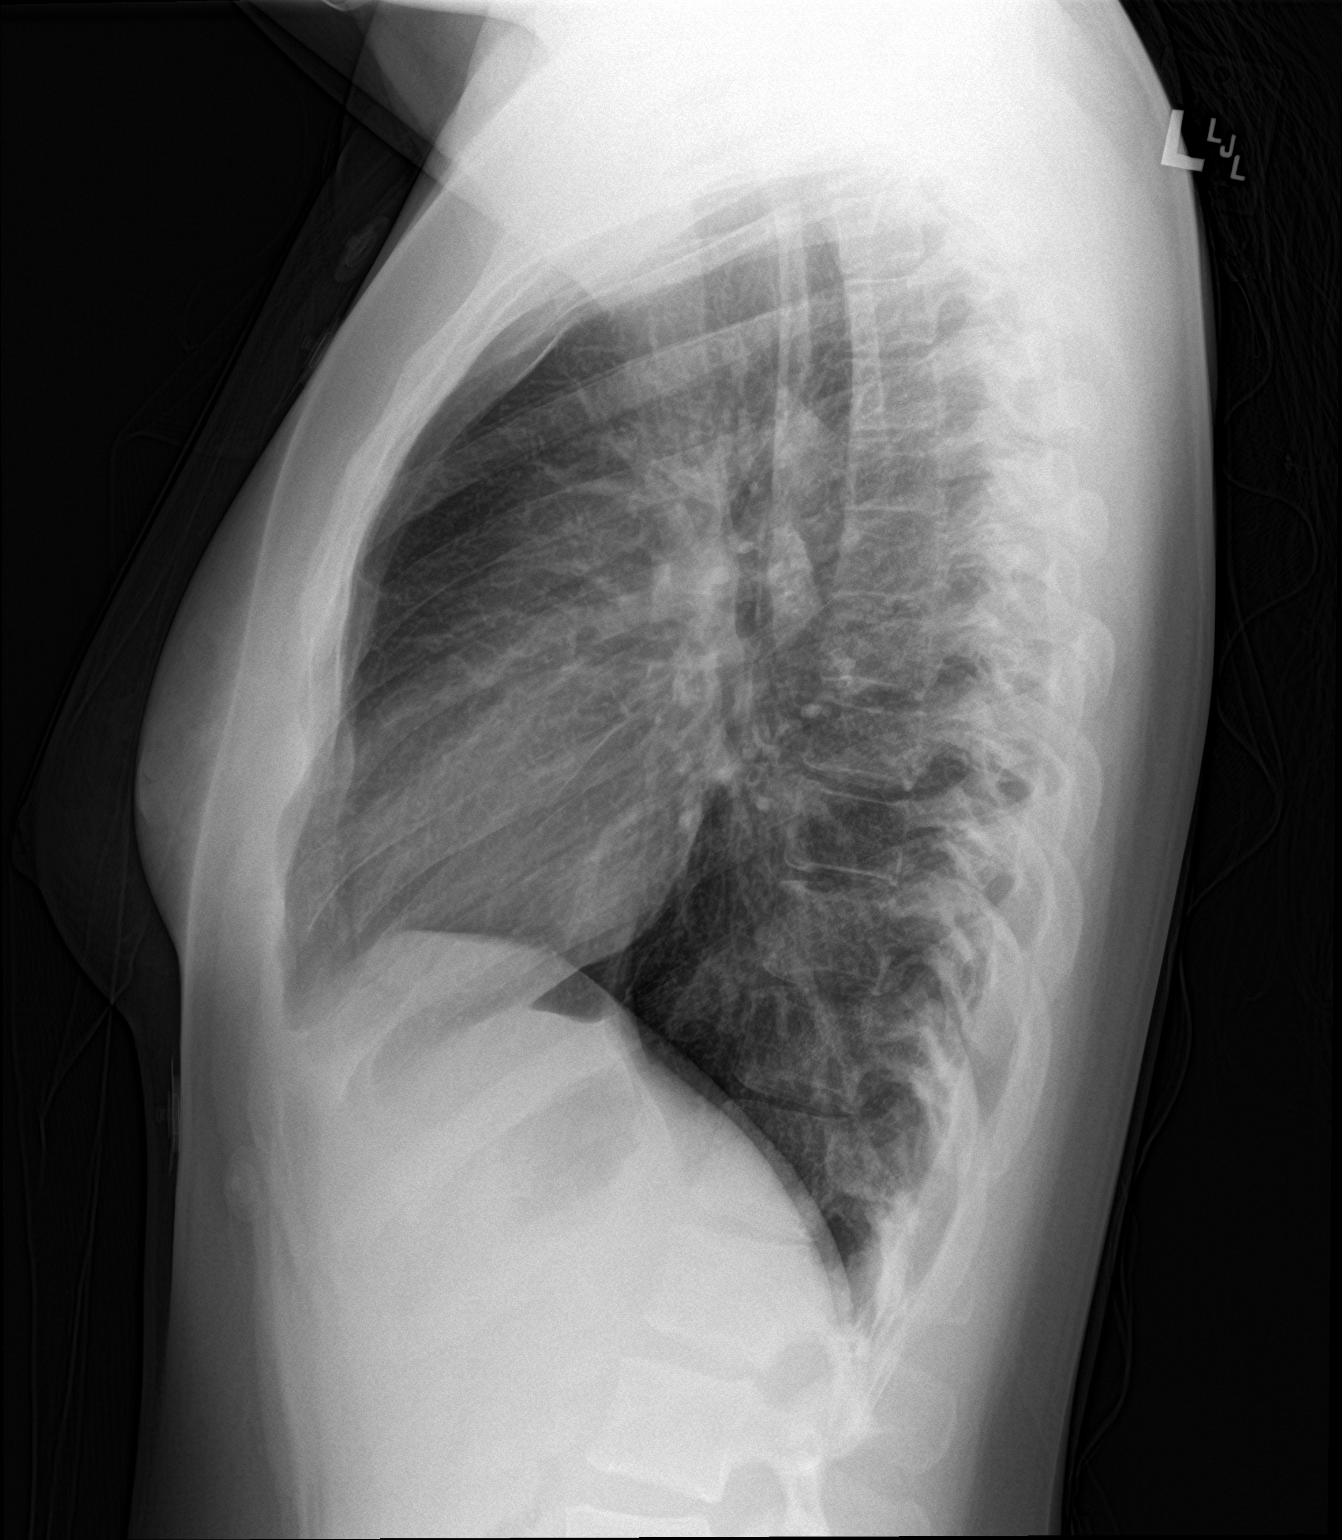

[2 of 2 positions shown; findings below may reference images not displayed]

FINDINGS: The heart size and mediastinal contours are within normal limits. No
focal consolidation. No pleural effusion. No pneumothorax. The
visualized skeletal structures are unremarkable.
IMPRESSION: No acute cardiopulmonary findings.

## 2022-09-22 ENCOUNTER — Observation Stay
Admission: EM | Admit: 2022-09-22 | Discharge: 2022-09-22 | Disposition: A | Discharge status: Home / Self Care | Payer: Medicaid Other | Attending: Advanced Practice Midwife | Admitting: Advanced Practice Midwife

## 2022-09-22 ENCOUNTER — Encounter: Payer: Self-pay | Admitting: Obstetrics and Gynecology

## 2022-09-22 ENCOUNTER — Other Ambulatory Visit: Payer: Self-pay

## 2022-09-22 DIAGNOSIS — O4692 Antepartum hemorrhage, unspecified, second trimester: Principal | ICD-10-CM | POA: Diagnosis present

## 2022-09-22 DIAGNOSIS — Z3A25 25 weeks gestation of pregnancy: Secondary | ICD-10-CM

## 2022-09-22 HISTORY — DX: Other specified health status: Z78.9

## 2022-09-22 NOTE — OB Triage Note (Signed)
Pt is a 22yo G1P0, 25w 0d. She arrived to the unit with complaints of vaginal bleeding that occurred after she toileted around 1230 - she stated that when she wiped she noticed bright red bleeding on her toilet paper. She did not place a peripad on and there has no bleeding or discharge since.  She states no contractions. She reports positive fetal movement.  VS stable, monitors applied and assessing.   Initial FHT 150 at 1433.

## 2022-09-22 NOTE — Discharge Summary (Signed)
Physician Final Progress Note  Patient ID: Kelly Ponce MRN: [id number] DOB/AGE: 23-21-2002 23 y.o.  Admit date: 09/22/2022 Admitting provider: Rod Can, CNM Discharge date: 09/22/2022   Admission Diagnoses:  1) intrauterine pregnancy at [redacted]w[redacted]d 2) vaginal bleeding  Discharge Diagnoses:  Principal Problem:   Vaginal bleeding in pregnancy, second trimester Active Problems:   [redacted] weeks gestation of pregnancy    History of Present Illness: The patient is a 22y.o. female G1P0 at 220w0dho presents for an episode of bright red bleeding when wiping at 12:30 PM today. She did not put a pad on. She denies any bleeding since then. Last intercourse was 2 months ago. She reports good fetal movement and denies leakage of fluid or contractions. She was admitted for observation and placed on monitors. All are reassuring. Sterile speculum exam. No evidence of any blood in vaginal vault or at cervix. She is discharged to home, accompanied by her mother, with instructions and precautions.    Past Medical History:  Diagnosis Date   Medical history non-contributory     Past Surgical History:  Procedure Laterality Date   NO PAST SURGERIES      No current facility-administered medications on file prior to encounter.   Current Outpatient Medications on File Prior to Encounter  Medication Sig Dispense Refill   Prenatal Vit-Fe Fumarate-FA (PRENATAL MULTIVITAMIN) TABS tablet Take 1 tablet by mouth daily at 12 noon.     albuterol (VENTOLIN HFA) 108 (90 Base) MCG/ACT inhaler Inhale 1-2 puffs into the lungs every 6 (six) hours as needed for wheezing or shortness of breath. (Patient not taking: Reported on 09/22/2022) 1 each 0   escitalopram (LEXAPRO) 10 MG tablet Take 1 tablet (10 mg total) by mouth daily. (Patient not taking: Reported on 09/22/2022) 30 tablet 1   fluticasone (FLONASE) 50 MCG/ACT nasal spray Place 2 sprays into both nostrils daily. (Patient not taking: Reported on 09/22/2022) 18.2 mL 1    ondansetron (ZOFRAN-ODT) 4 MG disintegrating tablet Take 1 tablet (4 mg total) by mouth every 8 (eight) hours as needed for nausea or vomiting. (Patient not taking: Reported on 09/22/2022) 15 tablet 0   oseltamivir (TAMIFLU) 75 MG capsule Take 1 capsule (75 mg total) by mouth every 12 (twelve) hours. (Patient not taking: Reported on 09/22/2022) 10 capsule 0   promethazine-dextromethorphan (PROMETHAZINE-DM) 6.25-15 MG/5ML syrup Take 5 mLs by mouth 4 (four) times daily as needed. (Patient not taking: Reported on 09/22/2022) 118 mL 0    No Known Allergies  Social History   Socioeconomic History   Marital status: Single    Spouse name: Not on file   Number of children: Not on file   Years of education: Not on file   Highest education level: Not on file  Occupational History   Not on file  Tobacco Use   Smoking status: Never   Smokeless tobacco: Never  Vaping Use   Vaping Use: Former  Substance and Sexual Activity   Alcohol use: Yes    Alcohol/week: 4.0 standard drinks of alcohol    Types: 4 Cans of beer per week   Drug use: Yes    Types: Marijuana   Sexual activity: Not Currently  Other Topics Concern   Not on file  Social History Narrative   Not on file   Social Determinants of Health   Financial Resource Strain: Not on file  Food Insecurity: Not on file  Transportation Needs: Not on file  Physical Activity: Not on file  Stress: Not  on file  Social Connections: Not on file  Intimate Partner Violence: Not on file    History reviewed. No pertinent family history.   Review of Systems  Constitutional:  Negative for chills and fever.  HENT:  Negative for congestion, ear discharge, ear pain, hearing loss, sinus pain and sore throat.   Eyes:  Negative for blurred vision and double vision.  Respiratory:  Negative for cough, shortness of breath and wheezing.   Cardiovascular:  Negative for chest pain, palpitations and leg swelling.  Gastrointestinal:  Negative for abdominal pain,  blood in stool, constipation, diarrhea, heartburn, melena, nausea and vomiting.  Genitourinary:  Negative for dysuria, flank pain, frequency, hematuria and urgency.       Positive for vaginal bleeding  Musculoskeletal:  Negative for back pain, joint pain and myalgias.  Skin:  Negative for itching and rash.  Neurological:  Negative for dizziness, tingling, tremors, sensory change, speech change, focal weakness, seizures, loss of consciousness, weakness and headaches.  Endo/Heme/Allergies:  Negative for environmental allergies. Does not bruise/bleed easily.  Psychiatric/Behavioral:  Negative for depression, hallucinations, memory loss, substance abuse and suicidal ideas. The patient is not nervous/anxious and does not have insomnia.      Physical Exam: BP (!) 97/55   Pulse 91   Temp 98.9 F (37.2 C) (Oral)   Resp 18   Ht '5\' 2"'$  (1.575 m)   Wt 67.1 kg   LMP 11/30/2021 (Exact Date)   BMI 27.07 kg/m   Constitutional: Well nourished, well developed female in no acute distress.  HEENT: normal Skin: Warm and dry.  Cardiovascular: Regular rate and rhythm.   Extremity:  no edema   Respiratory: Clear to auscultation bilateral. Normal respiratory effort Abdomen: FHT present Psych: Alert and Oriented x3. No memory deficits. Normal mood and affect.   Pelvic exam: (female chaperone present)  Sterile speculum exam is not limited by body habitus EGBUS: within normal limits Vagina: within normal limits and with normal mucosa  Cervix: appears closed  Toco: negative Fetal well being: 145 bpm, moderate variability, + 10x10 accelerations, -decelerations  Consults: None  Significant Findings/ Diagnostic Studies: none  Procedures: NST  Hospital Course: The patient was admitted to Labor and Delivery Triage for observation.   Discharge Condition: good  Disposition: Discharge disposition: 01-Home or Self Care  Diet: Regular diet  Discharge Activity: Activity as tolerated  Discharge  Instructions     Discharge activity:  No Restrictions   Complete by: As directed    Discharge diet:  No restrictions   Complete by: As directed       Allergies as of 09/22/2022   No Known Allergies      Medication List     STOP taking these medications    albuterol 108 (90 Base) MCG/ACT inhaler Commonly known as: Ventolin HFA   escitalopram 10 MG tablet Commonly known as: LEXAPRO   fluticasone 50 MCG/ACT nasal spray Commonly known as: FLONASE   ondansetron 4 MG disintegrating tablet Commonly known as: ZOFRAN-ODT   oseltamivir 75 MG capsule Commonly known as: TAMIFLU   promethazine-dextromethorphan 6.25-15 MG/5ML syrup Commonly known as: PROMETHAZINE-DM       TAKE these medications    prenatal multivitamin Tabs tablet Take 1 tablet by mouth daily at 12 noon.         Total time spent taking care of this patient: 21 minutes  Signed: Rod Can, CNM  09/22/2022, 3:39 PM

## 2022-09-22 NOTE — Progress Notes (Signed)
Discharge instructions provided to pt. Pt verbalizes understanding. Vaginal bleeding and discharge, contractions, and fetal movement reviewed by RN. Follow-up care reviewed. Pt discharged home with mother.

## 2022-10-04 ENCOUNTER — Other Ambulatory Visit: Payer: Self-pay

## 2022-10-04 ENCOUNTER — Emergency Department
Admission: EM | Admit: 2022-10-04 | Discharge: 2022-10-04 | Disposition: A | Discharge status: Home / Self Care | Payer: Medicaid Other | Attending: Emergency Medicine | Admitting: Emergency Medicine

## 2022-10-04 DIAGNOSIS — J029 Acute pharyngitis, unspecified: Secondary | ICD-10-CM | POA: Diagnosis present

## 2022-10-04 DIAGNOSIS — Z3A30 30 weeks gestation of pregnancy: Secondary | ICD-10-CM | POA: Diagnosis not present

## 2022-10-04 DIAGNOSIS — Z20822 Contact with and (suspected) exposure to covid-19: Secondary | ICD-10-CM | POA: Insufficient documentation

## 2022-10-04 DIAGNOSIS — O99513 Diseases of the respiratory system complicating pregnancy, third trimester: Secondary | ICD-10-CM | POA: Diagnosis not present

## 2022-10-04 DIAGNOSIS — J101 Influenza due to other identified influenza virus with other respiratory manifestations: Secondary | ICD-10-CM | POA: Insufficient documentation

## 2022-10-04 LAB — RESP PANEL BY RT-PCR (RSV, FLU A&B, COVID)  RVPGX2
Influenza A by PCR: NEGATIVE
Influenza B by PCR: POSITIVE — AB
Resp Syncytial Virus by PCR: NEGATIVE
SARS Coronavirus 2 by RT PCR: NEGATIVE

## 2022-10-04 LAB — GROUP A STREP BY PCR: Group A Strep by PCR: NOT DETECTED

## 2022-10-04 MED ORDER — OSELTAMIVIR PHOSPHATE 75 MG PO CAPS: 75.0000 mg | ORAL_CAPSULE | Freq: Two times a day (BID) | ORAL | 0 refills | Status: AC

## 2022-10-04 NOTE — Discharge Instructions (Signed)
Drink plenty of fluids Tylenol for pain and fever Return if worsening See your regular doctor as needed

## 2022-10-04 NOTE — ED Triage Notes (Signed)
Pt reports today started with sore throat and cough. Pt reports has not had any exposures that she [place] aware of and her cough is non productive.

## 2022-10-04 NOTE — ED Provider Notes (Signed)
[facility] Provider Note    Event Date/Time   First MD Initiated Contact with Patient 10/04/22 1110     (approximate)   History   Sore Throat and Cough   HPI  Kelly Ponce is a 22 y.o. female who is currently [redacted] weeks pregnant presents to the emergency department with complaints of fever, chills and bodyaches.  Symptoms started yesterday.  No vomiting or diarrhea.  No contractions.      Physical Exam   Triage Vital Signs: ED Triage Vitals  Enc Vitals Group     BP 10/04/22 0957 109/72     Pulse Rate 10/04/22 0957 (!) 109     Resp 10/04/22 0957 18     Temp 10/04/22 0957 98.3 F (36.8 C)     Temp Source 10/04/22 0957 Oral     SpO2 10/04/22 0957 100 %     Weight 10/04/22 0951 147 lb 11.3 oz (67 kg)     Height 10/04/22 0951 5\' 2"  (1.575 m)     Head Circumference --      Peak Flow --      Pain Score 10/04/22 0951 6     Pain Loc --      Pain Edu? --      Excl. in [place]? --     Most recent vital signs: Vitals:   10/04/22 0957  BP: 109/72  Pulse: (!) 109  Resp: 18  Temp: 98.3 F (36.8 C)  SpO2: 100%     General: Awake, no distress.   CV:  Good peripheral perfusion. regular rate and  rhythm Resp:  Normal effort. Lungs cta Abd:  No distention.   Other:     ED Results / Procedures / Treatments   Labs (all labs ordered are listed, but only abnormal results are displayed) Labs Reviewed  RESP PANEL BY RT-PCR (RSV, FLU A&B, COVID)  [id number] - Abnormal; Notable for the following components:      Result Value   Influenza B by PCR POSITIVE (*)    All other components within normal limits  GROUP A STREP BY PCR     EKG     RADIOLOGY     PROCEDURES:   Procedures   MEDICATIONS ORDERED IN ED: Medications - No data to display   IMPRESSION / MDM / [place] / ED COURSE  I reviewed the triage vital signs and the nursing notes.                              Differential diagnosis includes, but is not limited to,  influenza, COVID, RSV, CAP, viral illness, strep throat  Patient's presentation is most consistent with acute complicated illness / injury requiring diagnostic workup.   Respiratory panel is positive for influenza B.  Other labs are reassuring  patient was given a prescription for Tamiflu.  Follow-up with her regular doctor if not improving in 3 days.  Return emergency department worsening.  Did caution her that being this far along in pregnancy along with having flu can put her at risk for pneumonia.  Patient states she understands.  Discharged stable condition with a work note.      FINAL CLINICAL IMPRESSION(S) / ED DIAGNOSES   Final diagnoses:  Influenza B     Rx / DC Orders   ED Discharge Orders          Ordered    oseltamivir (TAMIFLU) 75 MG  capsule  2 times daily        10/04/22 1137             Note:  This document was prepared using Dragon voice recognition software and may include unintentional dictation errors.    Versie Starks, PA-C 10/04/22 1141    Blake Divine, MD 10/05/22 [phone]

## 2022-10-04 NOTE — ED Notes (Signed)
135HR FETUS PER SAM, RN

## 2022-10-05 DIAGNOSIS — Z3402 Encounter for supervision of normal first pregnancy, second trimester: Secondary | ICD-10-CM | POA: Diagnosis not present

## 2022-10-05 DIAGNOSIS — Z23 Encounter for immunization: Secondary | ICD-10-CM | POA: Diagnosis not present

## 2022-10-05 DIAGNOSIS — O99342 Other mental disorders complicating pregnancy, second trimester: Secondary | ICD-10-CM | POA: Diagnosis not present

## 2022-10-05 DIAGNOSIS — F32A Depression, unspecified: Secondary | ICD-10-CM | POA: Diagnosis not present

## 2022-12-04 ENCOUNTER — Ambulatory Visit: Payer: 59 | Admitting: Nurse Practitioner

## 2023-01-05 DIAGNOSIS — O99344 Other mental disorders complicating childbirth: Secondary | ICD-10-CM | POA: Diagnosis not present

## 2023-01-05 DIAGNOSIS — Z3A40 40 weeks gestation of pregnancy: Secondary | ICD-10-CM | POA: Diagnosis not present

## 2023-01-05 DIAGNOSIS — R000 Tachycardia, unspecified: Secondary | ICD-10-CM | POA: Diagnosis not present

## 2023-01-05 DIAGNOSIS — O26893 Other specified pregnancy related conditions, third trimester: Secondary | ICD-10-CM | POA: Diagnosis not present

## 2023-01-05 DIAGNOSIS — O480 Post-term pregnancy: Secondary | ICD-10-CM | POA: Diagnosis not present

## 2023-01-05 DIAGNOSIS — F32A Depression, unspecified: Secondary | ICD-10-CM | POA: Diagnosis not present

## 2023-01-05 DIAGNOSIS — Z87891 Personal history of nicotine dependence: Secondary | ICD-10-CM | POA: Diagnosis not present

## 2023-01-06 DIAGNOSIS — Z3A40 40 weeks gestation of pregnancy: Secondary | ICD-10-CM | POA: Diagnosis not present

## 2023-01-06 DIAGNOSIS — O480 Post-term pregnancy: Secondary | ICD-10-CM | POA: Diagnosis not present

## 2023-01-07 DIAGNOSIS — Z3403 Encounter for supervision of normal first pregnancy, third trimester: Secondary | ICD-10-CM | POA: Diagnosis not present

## 2023-01-09 DIAGNOSIS — M7989 Other specified soft tissue disorders: Secondary | ICD-10-CM | POA: Diagnosis not present

## 2023-01-09 DIAGNOSIS — O99893 Other specified diseases and conditions complicating puerperium: Secondary | ICD-10-CM | POA: Diagnosis not present

## 2023-01-09 DIAGNOSIS — R2243 Localized swelling, mass and lump, lower limb, bilateral: Secondary | ICD-10-CM | POA: Diagnosis not present

## 2023-03-02 DIAGNOSIS — Z3403 Encounter for supervision of normal first pregnancy, third trimester: Secondary | ICD-10-CM | POA: Diagnosis not present

## 2023-04-06 ENCOUNTER — Ambulatory Visit (INDEPENDENT_AMBULATORY_CARE_PROVIDER_SITE_OTHER): Payer: 59 | Admitting: Internal Medicine

## 2023-04-06 ENCOUNTER — Encounter: Payer: Self-pay | Admitting: Internal Medicine

## 2023-04-06 DIAGNOSIS — R8290 Unspecified abnormal findings in urine: Secondary | ICD-10-CM | POA: Diagnosis not present

## 2023-04-06 DIAGNOSIS — N390 Urinary tract infection, site not specified: Secondary | ICD-10-CM | POA: Diagnosis not present

## 2023-04-06 LAB — POCT URINALYSIS DIPSTICK
Bilirubin, UA: NEGATIVE
Blood, UA: NEGATIVE
Glucose, UA: NEGATIVE
Ketones, UA: NEGATIVE
Nitrite, UA: POSITIVE
Protein, UA: NEGATIVE
Spec Grav, UA: 1.025 (ref 1.010–1.025)
Urobilinogen, UA: 1 U/dL
pH, UA: 6 (ref 5.0–8.0)

## 2023-04-06 MED ORDER — AMOXICILLIN-POT CLAVULANATE 500-125 MG PO TABS
1.0000 | ORAL_TABLET | Freq: Two times a day (BID) | ORAL | 0 refills | Status: AC
Start: 2023-04-06 — End: 2023-04-13

## 2023-04-06 NOTE — Addendum Note (Signed)
Addended byKatherine Mantle on: 04/06/2023 04:11 PM   Modules accepted: Orders

## 2023-04-06 NOTE — Progress Notes (Signed)
Patient notified

## 2023-04-06 NOTE — Progress Notes (Signed)
Nitrite positive-,pus cells Start Amoxil/clav Check c/s. Return for repeat u/a. In 1 week-

## 2023-04-07 DIAGNOSIS — N390 Urinary tract infection, site not specified: Secondary | ICD-10-CM | POA: Diagnosis not present

## 2023-04-10 ENCOUNTER — Emergency Department
Admission: EM | Admit: 2023-04-10 | Discharge: 2023-04-10 | Disposition: A | Discharge status: Home / Self Care | Payer: 59 | Attending: Emergency Medicine | Admitting: Emergency Medicine

## 2023-04-10 DIAGNOSIS — Z20822 Contact with and (suspected) exposure to covid-19: Secondary | ICD-10-CM | POA: Insufficient documentation

## 2023-04-10 DIAGNOSIS — R519 Headache, unspecified: Secondary | ICD-10-CM | POA: Insufficient documentation

## 2023-04-10 DIAGNOSIS — R110 Nausea: Secondary | ICD-10-CM | POA: Insufficient documentation

## 2023-04-10 LAB — URINE CULTURE

## 2023-04-10 LAB — SARS CORONAVIRUS 2 BY RT PCR: SARS Coronavirus 2 by RT PCR: NEGATIVE

## 2023-04-10 MED ORDER — ONDANSETRON 4 MG PO TBDP: 4.0000 mg | ORAL_TABLET | Freq: Three times a day (TID) | ORAL | 0 refills | Status: DC | PRN

## 2023-04-10 MED ORDER — SODIUM CHLORIDE 0.9 % IV BOLUS: 1000.0000 mL | Freq: Once | INTRAVENOUS | Status: DC

## 2023-04-10 MED ORDER — METOCLOPRAMIDE HCL 5 MG/ML IJ SOLN
10.0000 mg | Freq: Once | INTRAMUSCULAR | Status: DC
  Filled 2023-04-10: qty 2

## 2023-04-10 MED ORDER — KETOROLAC TROMETHAMINE 15 MG/ML IJ SOLN
15.0000 mg | Freq: Once | INTRAMUSCULAR | Status: AC
  Administered 2023-04-10: 15 mg via INTRAMUSCULAR
  Filled 2023-04-10: qty 1

## 2023-04-10 MED ORDER — DIPHENHYDRAMINE HCL 50 MG/ML IJ SOLN
25.0000 mg | Freq: Once | INTRAMUSCULAR | Status: DC
  Filled 2023-04-10: qty 1

## 2023-04-10 MED ORDER — ONDANSETRON 4 MG PO TBDP
4.0000 mg | ORAL_TABLET | Freq: Once | ORAL | Status: AC
  Administered 2023-04-10: 4 mg via ORAL
  Filled 2023-04-10: qty 1

## 2023-04-10 NOTE — ED Notes (Signed)
This Clinical research associate tried to put an IV unsuccessfully x3. PA-C aware. Patient was encouraged to drink per PA-C and evaluate if PO meds were effective. Patient is playing with her infant daughter and tolerating the room lighting well.

## 2023-04-10 NOTE — ED Triage Notes (Addendum)
Pt to ED c/o HA x 4 days. Denies visual disturbances. No vomiting. Reports light sensitivity.  A&O X 4 in triage. Of note pt mother reports HA and infant child is positive for COVID.

## 2023-04-10 NOTE — ED Provider Notes (Signed)
[facility] Provider Note    Event Date/Time   First MD Initiated Contact with Patient 04/10/23 1303     (approximate)   History   Headache   HPI  Kelly Ponce is a 22 y.o. female with no significant past medical history presents emergency department with a headache for 4 days.  Patient's infant child has COVID.  Patient states she has had some nausea but no vomiting or diarrhea.  No cough or congestion.  States sensitive to light.  States every time she tries to take Tylenol or ibuprofen she has vomited it up.  No urinary symptoms.  No chest pain or shortness of breath      Physical Exam   Triage Vital Signs: ED Triage Vitals  Encounter Vitals Group     BP 04/10/23 1228 116/80     Systolic BP Percentile --      Diastolic BP Percentile --      Pulse Rate 04/10/23 1228 (!) 105     Resp 04/10/23 1228 18     Temp 04/10/23 1228 99.7 F (37.6 C)     Temp Source 04/10/23 1228 Oral     SpO2 04/10/23 1228 95 %     Weight 04/10/23 1230 170 lb (77.1 kg)     Height 04/10/23 1230 5\' 2"  (1.575 m)     Head Circumference --      Peak Flow --      Pain Score 04/10/23 1229 10     Pain Loc --      Pain Education --      Exclude from Growth Chart --     Most recent vital signs: Vitals:   04/10/23 1228  BP: 116/80  Pulse: (!) 105  Resp: 18  Temp: 99.7 F (37.6 C)  SpO2: 95%     General: Awake, no distress.   CV:  Good peripheral perfusion. regular rate and  rhythm Resp:  Normal effort. Lungs cta Abd:  No distention.   Other:  Cranial nerves II through XII grossly intact   ED Results / Procedures / Treatments   Labs (all labs ordered are listed, but only abnormal results are displayed) Labs Reviewed  SARS CORONAVIRUS 2 BY RT PCR     EKG     RADIOLOGY     PROCEDURES:   Procedures   MEDICATIONS ORDERED IN ED: Medications  sodium chloride 0.9 % bolus 1,000 mL (has no administration in time range)  metoCLOPramide (REGLAN)  injection 10 mg (has no administration in time range)  diphenhydrAMINE (BENADRYL) injection 25 mg (has no administration in time range)  ketorolac (TORADOL) 15 MG/ML injection 15 mg (15 mg Intramuscular Given 04/10/23 1319)  ondansetron (ZOFRAN-ODT) disintegrating tablet 4 mg (4 mg Oral Given 04/10/23 1319)     IMPRESSION / MDM / ASSESSMENT AND PLAN / ED COURSE  I reviewed the triage vital signs and the nursing notes.                              Differential diagnosis includes, but is not limited to, migraine, COVID, tension headache, dehydration  Patient's presentation is most consistent with acute illness / injury with system symptoms.   Due to the patient's small infant having covid I feel the patient warrants likely has COVID.  Will give Toradol 15 mg IM along with Zofran ODT.  Will await COVID results.   COVID test reassuring  Patient still complaining  of headache after zofran and toradol Will do Iv fluids, ns 1l and reglan  Nursing staff unable to get IV.  Will try p.o. liquids.  Patient states she is a little better with just p.o. liquids.  Will discontinue the previous order for IV fluids and Reglan.  She is to follow-up with her regular doctor if not improving in 3 days.  Return if worsening.  I did encourage her to retest herself for COVID in about 2 days.  She is in agreement treatment plan.  Discharged stable condition.  FINAL CLINICAL IMPRESSION(S) / ED DIAGNOSES   Final diagnoses:  Bad headache     Rx / DC Orders   ED Discharge Orders          Ordered    ondansetron (ZOFRAN-ODT) 4 MG disintegrating tablet  Every 8 hours PRN        04/10/23 1456             Note:  This document was prepared using Dragon voice recognition software and may include unintentional dictation errors.    Faythe Ghee, PA-C 04/10/23 1457    Jene Every, MD 04/10/23 [phone]

## 2023-04-10 NOTE — Discharge Instructions (Addendum)
Take Tylenol and ibuprofen for pain.  Zofran ODT if any nausea or vomiting. Drink plenty of fluids Retest yourself with a home test in 2 days for COVID. Return if worsening

## 2023-04-12 NOTE — Progress Notes (Signed)
Patient notified

## 2023-04-13 ENCOUNTER — Ambulatory Visit: Payer: 59 | Admitting: Cardiology

## 2023-04-27 ENCOUNTER — Encounter: Payer: Self-pay | Admitting: Cardiology

## 2023-04-27 ENCOUNTER — Ambulatory Visit (INDEPENDENT_AMBULATORY_CARE_PROVIDER_SITE_OTHER): Payer: 59 | Admitting: Cardiology

## 2023-04-27 VITALS — BP 108/64 | HR 79 | Ht 63.0 in | Wt 166.6 lb

## 2023-04-27 DIAGNOSIS — N390 Urinary tract infection, site not specified: Secondary | ICD-10-CM | POA: Diagnosis not present

## 2023-04-27 HISTORY — DX: Urinary tract infection, site not specified: N39.0

## 2023-04-27 LAB — POCT URINALYSIS DIPSTICK
Bilirubin, UA: NEGATIVE
Glucose, UA: NEGATIVE
Ketones, UA: NEGATIVE
Nitrite, UA: NEGATIVE
Protein, UA: NEGATIVE
Spec Grav, UA: 1.025 (ref 1.010–1.025)
Urobilinogen, UA: 0.2 U/dL
pH, UA: 6 (ref 5.0–8.0)

## 2023-04-27 MED ORDER — CIPROFLOXACIN HCL 500 MG PO TABS: 500.0000 mg | ORAL_TABLET | Freq: Two times a day (BID) | ORAL | 0 refills | Status: AC

## 2023-04-27 NOTE — Progress Notes (Signed)
Established Patient Office Visit  Subjective:  Patient ID: Kelly Ponce, female    DOB: 05-Oct-2000  Age: 22 y.o. MRN: [id number]  Chief Complaint  Patient presents with   Follow-up    Follow up from UTI    Patient in office for follow up from a UTI. Patient continues to have foul smelling urine. Denies pain, fever. Patient states she completed antibiotic prescribed on 04/07/2023. Urine today continues to have trace leukocytes. Will send for culture. Will send in Cipro. Return in 3 weeks for repeat UA. Recommend eating yogurt or taking a probiotic while taking ATB.   Urinary Tract Infection  This is a new problem. The current episode started 1 to 4 weeks ago. The problem occurs every urination. The problem has been unchanged. The patient is experiencing no pain. There has been no fever. Pertinent negatives include no flank pain, hematuria or urgency. She has tried antibiotics for the symptoms. The treatment provided no relief.    No other concerns at this time.   Past Medical History:  Diagnosis Date   Medical history non-contributory     Past Surgical History:  Procedure Laterality Date   NO PAST SURGERIES      Social History   Socioeconomic History   Marital status: Single    Spouse name: Not on file   Number of children: Not on file   Years of education: Not on file   Highest education level: Not on file  Occupational History   Not on file  Tobacco Use   Smoking status: Never   Smokeless tobacco: Never  Vaping Use   Vaping status: Former  Substance and Sexual Activity   Alcohol use: Yes    Alcohol/week: 4.0 standard drinks of alcohol    Types: 4 Cans of beer per week   Drug use: Yes    Types: Marijuana   Sexual activity: Not Currently  Other Topics Concern   Not on file  Social History Narrative   Not on file   Social Determinants of Health   Financial Resource Strain: Not on file  Food Insecurity: Not on file  Transportation Needs: Not on file   Physical Activity: Not on file  Stress: Not on file  Social Connections: Not on file  Intimate Partner Violence: Not At Risk (07/27/2022)   Received from [facility], [facility]   Humiliation, Afraid, Rape, and Kick questionnaire    Fear of Current or Ex-Partner: No    Emotionally Abused: No    Physically Abused: No    Sexually Abused: No    No family history on file.  No Known Allergies  Review of Systems  Constitutional: Negative.   HENT: Negative.    Eyes: Negative.   Respiratory: Negative.  Negative for shortness of breath.   Cardiovascular: Negative.  Negative for chest pain.  Gastrointestinal: Negative.  Negative for abdominal pain, constipation and diarrhea.  Genitourinary: Negative.  Negative for dysuria, flank pain, hematuria and urgency.  Musculoskeletal:  Negative for joint pain and myalgias.  Skin: Negative.   Neurological: Negative.  Negative for dizziness and headaches.  Endo/Heme/Allergies: Negative.   All other systems reviewed and are negative.      Objective:   BP 108/64   Pulse 79   Ht 5\' 3"  (1.6 m)   Wt 166 lb 9.6 oz (75.6 kg)   LMP 03/21/2023   SpO2 97%   BMI 29.51 kg/m   Vitals:   04/27/23 0919  BP: 108/64  Pulse:  79  Height: 5\' 3"  (1.6 m)  Weight: 166 lb 9.6 oz (75.6 kg)  SpO2: 97%  BMI (Calculated): 29.52    Physical Exam Vitals and nursing note reviewed.  Constitutional:      Appearance: Normal appearance. She is normal weight.  HENT:     Head: Normocephalic and atraumatic.     Nose: Nose normal.     Mouth/Throat:     Mouth: Mucous membranes are moist.  Eyes:     Extraocular Movements: Extraocular movements intact.     Conjunctiva/sclera: Conjunctivae normal.     Pupils: Pupils are equal, round, and reactive to light.  Cardiovascular:     Rate and Rhythm: Normal rate and regular rhythm.     Pulses: Normal pulses.     Heart sounds: Normal heart sounds.  Pulmonary:     Effort: Pulmonary effort is normal.      Breath sounds: Normal breath sounds.  Abdominal:     General: Abdomen is flat. Bowel sounds are normal.     Palpations: Abdomen is soft.  Musculoskeletal:        General: Normal range of motion.     Cervical back: Normal range of motion.  Skin:    General: Skin is warm and dry.  Neurological:     General: No focal deficit present.     Mental Status: She is alert and oriented to person, place, and time.  Psychiatric:        Mood and Affect: Mood normal.        Behavior: Behavior normal.        Thought Content: Thought content normal.        Judgment: Judgment normal.      No results found for any visits on 04/27/23.  Recent Results (from the past 2160 hour(s))  POCT Urinalysis Dipstick ([address])     Status: Abnormal   Collection Time: 04/06/23  1:02 PM  Result Value Ref Range   Color, UA orange    Clarity, UA cloudy    Glucose, UA Negative Negative   Bilirubin, UA neg    Ketones, UA neg    Spec Grav, UA 1.025 1.010 - 1.025   Blood, UA neg    pH, UA 6.0 5.0 - 8.0   Protein, UA Negative Negative   Urobilinogen, UA 1.0 0.2 or 1.0 E.U./dL   Nitrite, UA pos    Leukocytes, UA Small (1+) (A) Negative   Appearance cloudy    Odor yes   Culture, Urine     Status: Abnormal   Collection Time: 04/07/23  9:39 AM   Specimen: Urine   UC  Result Value Ref Range   Urine Culture, Routine Final report (A)    Organism ID, Bacteria Escherichia coli (A)     Comment: Cefazolin <=4 ug/mL Cefazolin with an MIC <=16 predicts susceptibility to the oral agents cefaclor, cefdinir, cefpodoxime, cefprozil, cefuroxime, cephalexin, and loracarbef when used for therapy of uncomplicated urinary tract infections due to E. coli, Klebsiella pneumoniae, and Proteus mirabilis. Greater than 100,000 colony forming units per mL    Antimicrobial Susceptibility Comment     Comment:       ** S = Susceptible; I = Intermediate; R = Resistant **                    P = Positive; N = Negative             MICS  are expressed in micrograms per mL  Antibiotic                 RSLT#1    RSLT#2    RSLT#3    RSLT#4 Amoxicillin/Clavulanic Acid    S Ampicillin                     S Cefepime                       S Ceftriaxone                    S Cefuroxime                     S Ciprofloxacin                  S Ertapenem                      S Gentamicin                     S Imipenem                       S Levofloxacin                   S Meropenem                      S Nitrofurantoin                 S Piperacillin/Tazobactam        S Tetracycline                   S Tobramycin                     S Trimethoprim/Sulfa             R   SARS Coronavirus 2 by RT PCR (hospital order, performed in [facility] hospital lab) *cepheid single result test* Anterior Nasal Swab     Status: None   Collection Time: 04/10/23 12:33 PM   Specimen: Anterior Nasal Swab  Result Value Ref Range   SARS Coronavirus 2 by RT PCR NEGATIVE NEGATIVE    Comment: (NOTE) SARS-CoV-2 target nucleic acids are NOT DETECTED.  The SARS-CoV-2 RNA is generally detectable in upper and lower respiratory specimens during the acute phase of infection. The lowest concentration of SARS-CoV-2 viral copies this assay can detect is 250 copies / mL. A negative result does not preclude SARS-CoV-2 infection and should not be used as the sole basis for treatment or other patient management decisions.  A negative result may occur with improper specimen collection / handling, submission of specimen other than nasopharyngeal swab, presence of viral mutation(s) within the areas targeted by this assay, and inadequate number of viral copies (<250 copies / mL). A negative result must be combined with clinical observations, patient history, and epidemiological information.  Fact Sheet for Patients:   RoadLapTop.co.za  Fact Sheet for Healthcare Providers: http://kim-miller.com/  This test is not yet  approved or  cleared by the [place] FDA and has been authorized for detection and/or diagnosis of SARS-CoV-2 by FDA under an Emergency Use Authorization (EUA).  This EUA will remain in effect (meaning this test can be used) for the duration of the COVID-19 declaration under Section 564(b)(1) of the Act, 21 U.S.C. section 360bbb-3(b)(1),  unless the authorization is terminated or revoked sooner.  Performed at [facility], [address], [place], [place] [address]       Assessment & Plan:  Cipro sent to pharmacy.  Return in 3 weeks for repeat UA.  Yogurt or probiotic for duration of ATB.   Problem List Items Addressed This Visit   None Visit Diagnoses     Urinary tract infection without hematuria, site unspecified    -  Primary   Relevant Orders   POCT Urinalysis Dipstick ([address])       No follow-ups on file.   Total time spent: 25 minutes  [employer], NP  04/27/2023   This document may have been prepared by Dragon Voice Recognition software and as such may include unintentional dictation errors.

## 2023-04-29 LAB — URINE CULTURE

## 2023-04-30 NOTE — Progress Notes (Signed)
Patient notified

## 2023-05-18 ENCOUNTER — Ambulatory Visit (INDEPENDENT_AMBULATORY_CARE_PROVIDER_SITE_OTHER): Payer: 59 | Admitting: Family

## 2023-05-18 ENCOUNTER — Other Ambulatory Visit: Payer: Self-pay | Admitting: Cardiology

## 2023-05-18 DIAGNOSIS — N390 Urinary tract infection, site not specified: Secondary | ICD-10-CM

## 2023-05-18 LAB — POCT URINALYSIS DIPSTICK
Bilirubin, UA: NEGATIVE
Glucose, UA: NEGATIVE
Ketones, UA: NEGATIVE
Nitrite, UA: NEGATIVE
Protein, UA: NEGATIVE
Spec Grav, UA: >=1.03 — AB (ref 1.010–1.025)
Urobilinogen, UA: 0.2 U/dL
pH, UA: 6 (ref 5.0–8.0)

## 2023-05-18 MED ORDER — AMOXICILLIN-POT CLAVULANATE 875-125 MG PO TABS
1.0000 | ORAL_TABLET | Freq: Two times a day (BID) | ORAL | 0 refills | Status: AC
Start: 2023-05-18 — End: 2023-05-25

## 2023-05-18 NOTE — Progress Notes (Signed)
Sent message via mychart

## 2023-05-20 LAB — URINE CULTURE: Organism ID, Bacteria: NO GROWTH

## 2023-05-21 NOTE — Progress Notes (Signed)
Sent message via mychart

## 2023-05-26 ENCOUNTER — Ambulatory Visit (INDEPENDENT_AMBULATORY_CARE_PROVIDER_SITE_OTHER): Payer: 59 | Admitting: Cardiology

## 2023-05-26 ENCOUNTER — Encounter: Payer: Self-pay | Admitting: Cardiology

## 2023-05-26 VITALS — BP 100/70 | HR 94 | Ht 62.0 in | Wt 165.8 lb

## 2023-05-26 DIAGNOSIS — Z0130 Encounter for examination of blood pressure without abnormal findings: Secondary | ICD-10-CM

## 2023-05-26 DIAGNOSIS — R109 Unspecified abdominal pain: Secondary | ICD-10-CM | POA: Diagnosis not present

## 2023-05-26 DIAGNOSIS — N390 Urinary tract infection, site not specified: Secondary | ICD-10-CM

## 2023-05-26 LAB — POCT URINALYSIS DIPSTICK
Bilirubin, UA: NEGATIVE
Blood, UA: NEGATIVE
Glucose, UA: NEGATIVE
Ketones, UA: NEGATIVE
Nitrite, UA: NEGATIVE
Protein, UA: NEGATIVE
Spec Grav, UA: 1.015 (ref 1.010–1.025)
Urobilinogen, UA: 0.2 U/dL
pH, UA: 7 (ref 5.0–8.0)

## 2023-05-26 MED ORDER — LEVOFLOXACIN 250 MG PO TABS: 250.0000 mg | ORAL_TABLET | Freq: Every day | ORAL | 0 refills | Status: AC

## 2023-05-26 NOTE — Progress Notes (Signed)
Established Patient Office Visit  Subjective:  Patient ID: Kelly Ponce, female    DOB: 11-07-2000  Age: 22 y.o. MRN: [id number]  Chief Complaint  Patient presents with   Acute Visit    Left side pain    Patient in office for an acute visit, complaining of left side pain, radiating to the right side. Patient reports most recent BM yesterday, states it was loose. UA today abnormal, will send in ATB. Educated patient on proper wiping technique to prevent UTIs. Will refer to urology for frequent Utis.   Abdominal Pain This is a new problem. The current episode started today. The onset quality is sudden. The problem occurs constantly. The problem has been unchanged. The pain is located in the left flank. The pain is mild. The quality of the pain is sharp. The abdominal pain radiates to the right flank. Associated symptoms include diarrhea. Pertinent negatives include no constipation, dysuria, fever, headaches or myalgias. The pain is aggravated by certain positions. The pain is relieved by Nothing. She has tried nothing for the symptoms.    No other concerns at this time.   Past Medical History:  Diagnosis Date   Medical history non-contributory     Past Surgical History:  Procedure Laterality Date   NO PAST SURGERIES      Social History   Socioeconomic History   Marital status: Single    Spouse name: Not on file   Number of children: Not on file   Years of education: Not on file   Highest education level: Not on file  Occupational History   Not on file  Tobacco Use   Smoking status: Never   Smokeless tobacco: Never  Vaping Use   Vaping status: Former  Substance and Sexual Activity   Alcohol use: Yes    Alcohol/week: 4.0 standard drinks of alcohol    Types: 4 Cans of beer per week   Drug use: Yes    Types: Marijuana   Sexual activity: Not Currently  Other Topics Concern   Not on file  Social History Narrative   Not on file   Social Determinants of Health    Financial Resource Strain: Not on file  Food Insecurity: Not on file  Transportation Needs: Not on file  Physical Activity: Not on file  Stress: Not on file  Social Connections: Not on file  Intimate Partner Violence: Not At Risk (07/27/2022)   Received from [facility]   Humiliation, Afraid, Rape, and Kick questionnaire    Fear of Current or Ex-Partner: No    Emotionally Abused: No    Physically Abused: No    Sexually Abused: No    History reviewed. No pertinent family history.  No Known Allergies  Review of Systems  Constitutional: Negative.  Negative for fever.  HENT: Negative.    Eyes: Negative.   Respiratory: Negative.  Negative for shortness of breath.   Cardiovascular: Negative.  Negative for chest pain.  Gastrointestinal:  Positive for abdominal pain and diarrhea. Negative for constipation.  Genitourinary:  Positive for flank pain. Negative for dysuria.  Musculoskeletal:  Negative for joint pain and myalgias.  Skin: Negative.   Neurological: Negative.  Negative for dizziness and headaches.  Endo/Heme/Allergies: Negative.   All other systems reviewed and are negative.      Objective:   BP 100/70   Pulse 94   Ht 5\' 2"  (1.575 m)   Wt 165 lb 12.8 oz (75.2 kg)   SpO2 98%  BMI 30.33 kg/m   Vitals:   05/26/23 1037  BP: 100/70  Pulse: 94  Height: 5\' 2"  (1.575 m)  Weight: 165 lb 12.8 oz (75.2 kg)  SpO2: 98%  BMI (Calculated): 30.32    Physical Exam Vitals and nursing note reviewed.  Constitutional:      Appearance: Normal appearance. She is normal weight.  HENT:     Head: Normocephalic and atraumatic.     Nose: Nose normal.     Mouth/Throat:     Mouth: Mucous membranes are moist.  Eyes:     Extraocular Movements: Extraocular movements intact.     Conjunctiva/sclera: Conjunctivae normal.     Pupils: Pupils are equal, round, and reactive to light.  Cardiovascular:     Rate and Rhythm: Normal rate and regular rhythm.      Pulses: Normal pulses.     Heart sounds: Normal heart sounds.  Pulmonary:     Effort: Pulmonary effort is normal.     Breath sounds: Normal breath sounds.  Abdominal:     General: Abdomen is flat. Bowel sounds are normal.     Palpations: Abdomen is soft.  Musculoskeletal:        General: Normal range of motion.     Cervical back: Normal range of motion.  Skin:    General: Skin is warm and dry.  Neurological:     General: No focal deficit present.     Mental Status: She is alert and oriented to person, place, and time.  Psychiatric:        Mood and Affect: Mood normal.        Behavior: Behavior normal.        Thought Content: Thought content normal.        Judgment: Judgment normal.      Results for orders placed or performed in visit on 05/26/23  POCT Urinalysis Dipstick ([id number])  Result Value Ref Range   Color, UA orange    Clarity, UA cloudy    Glucose, UA Negative Negative   Bilirubin, UA neg    Ketones, UA neg    Spec Grav, UA 1.015 1.010 - 1.025   Blood, UA neg    pH, UA 7.0 5.0 - 8.0   Protein, UA Negative Negative   Urobilinogen, UA 0.2 0.2 or 1.0 E.U./dL   Nitrite, UA neg    Leukocytes, UA Small (1+) (A) Negative   Appearance cloudy    Odor yes     Recent Results (from the past 2160 hour(s))  POCT Urinalysis Dipstick ([id number])     Status: Abnormal   Collection Time: 04/06/23  1:02 PM  Result Value Ref Range   Color, UA orange    Clarity, UA cloudy    Glucose, UA Negative Negative   Bilirubin, UA neg    Ketones, UA neg    Spec Grav, UA 1.025 1.010 - 1.025   Blood, UA neg    pH, UA 6.0 5.0 - 8.0   Protein, UA Negative Negative   Urobilinogen, UA 1.0 0.2 or 1.0 E.U./dL   Nitrite, UA pos    Leukocytes, UA Small (1+) (A) Negative   Appearance cloudy    Odor yes   Culture, Urine     Status: Abnormal   Collection Time: 04/07/23  9:39 AM   Specimen: Urine   UC  Result Value Ref Range   Urine Culture, Routine Final report (A)    Organism ID, Bacteria  Escherichia coli (A)     Comment: Cefazolin <=  4 ug/mL Cefazolin with an MIC <=16 predicts susceptibility to the oral agents cefaclor, cefdinir, cefpodoxime, cefprozil, cefuroxime, cephalexin, and loracarbef when used for therapy of uncomplicated urinary tract infections due to E. coli, Klebsiella pneumoniae, and Proteus mirabilis. Greater than 100,000 colony forming units per mL    Antimicrobial Susceptibility Comment     Comment:       ** S = Susceptible; I = Intermediate; R = Resistant **                    P = Positive; N = Negative             MICS are expressed in micrograms per mL    Antibiotic                 RSLT#1    RSLT#2    RSLT#3    RSLT#4 Amoxicillin/Clavulanic Acid    S Ampicillin                     S Cefepime                       S Ceftriaxone                    S Cefuroxime                     S Ciprofloxacin                  S Ertapenem                      S Gentamicin                     S Imipenem                       S Levofloxacin                   S Meropenem                      S Nitrofurantoin                 S Piperacillin/Tazobactam        S Tetracycline                   S Tobramycin                     S Trimethoprim/Sulfa             R   SARS Coronavirus 2 by RT PCR (hospital order, performed in [facility] hospital lab) *cepheid single result test* Anterior Nasal Swab     Status: None   Collection Time: 04/10/23 12:33 PM   Specimen: Anterior Nasal Swab  Result Value Ref Range   SARS Coronavirus 2 by RT PCR NEGATIVE NEGATIVE    Comment: (NOTE) SARS-CoV-2 target nucleic acids are NOT DETECTED.  The SARS-CoV-2 RNA is generally detectable in upper and lower respiratory specimens during the acute phase of infection. The lowest concentration of SARS-CoV-2 viral copies this assay can detect is 250 copies / mL. A negative result does not preclude SARS-CoV-2 infection and should not be used as the sole basis for treatment or other patient  management decisions.  A negative result may occur with improper specimen collection /  handling, submission of specimen other than nasopharyngeal swab, presence of viral mutation(s) within the areas targeted by this assay, and inadequate number of viral copies (<250 copies / mL). A negative result must be combined with clinical observations, patient history, and epidemiological information.  Fact Sheet for Patients:   RoadLapTop.co.za  Fact Sheet for Healthcare Providers: http://kim-miller.com/  This test is not yet approved or  cleared by the [place] FDA and has been authorized for detection and/or diagnosis of SARS-CoV-2 by FDA under an Emergency Use Authorization (EUA).  This EUA will remain in effect (meaning this test can be used) for the duration of the COVID-19 declaration under Section 564(b)(1) of the Act, 21 U.S.C. section 360bbb-3(b)(1), unless the authorization is terminated or revoked sooner.  Performed at [facility], [address] [place], [place] [address]   POCT Urinalysis Dipstick [phone])     Status: Abnormal   Collection Time: 04/27/23  9:22 AM  Result Value Ref Range   Color, UA orange    Clarity, UA cloudy    Glucose, UA Negative Negative   Bilirubin, UA neg    Ketones, UA neg    Spec Grav, UA 1.025 1.010 - 1.025   Blood, UA small    pH, UA 6.0 5.0 - 8.0   Protein, UA Negative Negative   Urobilinogen, UA 0.2 0.2 or 1.0 E.U./dL   Nitrite, UA neg    Leukocytes, UA Small (1+) (A) Negative   Appearance cloudy    Odor yes   Culture, Urine     Status: Abnormal   Collection Time: 04/27/23  1:02 PM   Specimen: Urine   UC  Result Value Ref Range   Urine Culture, Routine Final report (A)    Organism ID, Bacteria Escherichia coli (A)     Comment: Cefazolin <=4 ug/mL Cefazolin with an MIC <=16 predicts susceptibility to the oral agents cefaclor, cefdinir, cefpodoxime, cefprozil, cefuroxime,  cephalexin, and loracarbef when used for therapy of uncomplicated urinary tract infections due to E. coli, Klebsiella pneumoniae, and Proteus mirabilis. Greater than 100,000 colony forming units per mL    Antimicrobial Susceptibility Comment     Comment:       ** S = Susceptible; I = Intermediate; R = Resistant **                    P = Positive; N = Negative             MICS are expressed in micrograms per mL    Antibiotic                 RSLT#1    RSLT#2    RSLT#3    RSLT#4 Amoxicillin/Clavulanic Acid    S Ampicillin                     R Cefepime                       S Ceftriaxone                    S Cefuroxime                     S Ciprofloxacin                  S Ertapenem  S Gentamicin                     S Imipenem                       S Levofloxacin                   I Meropenem                      S Nitrofurantoin                 S Piperacillin/Tazobactam        S Tetracycline                   S Tobramycin                     S Trimethoprim/Sulfa             S   POCT urinalysis dipstick     Status: Abnormal   Collection Time: 05/18/23  9:24 AM  Result Value Ref Range   Color, UA     Clarity, UA     Glucose, UA Negative Negative   Bilirubin, UA Negative    Ketones, UA Negative    Spec Grav, UA >=1.030 (A) 1.010 - 1.025   Blood, UA Nrgative    pH, UA 6.0 5.0 - 8.0   Protein, UA Negative Negative   Urobilinogen, UA 0.2 0.2 or 1.0 E.U./dL   Nitrite, UA Negative    Leukocytes, UA Moderate (2+) (A) Negative   Appearance     Odor    Culture, Urine     Status: None   Collection Time: 05/18/23 10:17 AM   Specimen: Urine   UC  Result Value Ref Range   Urine Culture, Routine Final report    Organism ID, Bacteria No growth   POCT Urinalysis Dipstick ([id number])     Status: Abnormal   Collection Time: 05/26/23 10:42 AM  Result Value Ref Range   Color, UA orange    Clarity, UA cloudy    Glucose, UA Negative Negative   Bilirubin, UA neg     Ketones, UA neg    Spec Grav, UA 1.015 1.010 - 1.025   Blood, UA neg    pH, UA 7.0 5.0 - 8.0   Protein, UA Negative Negative   Urobilinogen, UA 0.2 0.2 or 1.0 E.U./dL   Nitrite, UA neg    Leukocytes, UA Small (1+) (A) Negative   Appearance cloudy    Odor yes       Assessment & Plan:  Levaquin for UTI.  Wipe front to back after urinating.  Refer to urology.   Problem List Items Addressed This Visit       Genitourinary   Urinary tract infection without hematuria   Relevant Orders   Culture, Urine     Other   Left sided abdominal pain - Primary   Relevant Orders   POCT Urinalysis Dipstick ([id number]) (Completed)   Other Visit Diagnoses     Frequent UTI       Relevant Orders   Ambulatory referral to Urology       Return in about 2 weeks (around 06/09/2023) for urine only.   Total time spent: 25 minutes  Google, NP  05/26/2023   This document may have been prepared by Dragon Voice Recognition software and as such may include unintentional  dictation errors.

## 2023-05-28 LAB — URINE CULTURE

## 2023-05-30 NOTE — Progress Notes (Signed)
   CHIEF COMPLAINT  UA/ only visit fot UTI     REASON FOR VISIT  Possible UTI, UA Visit Only      ASSESSMENT & PLAN Diagnoses and all orders for this visit:  Urinary tract infection without hematuria, site unspecified -     POCT urinalysis dipstick     Patient notified.  Total time spent: 5 minutes  Miki Kins, FNP 05/18/2023

## 2023-06-09 ENCOUNTER — Ambulatory Visit (INDEPENDENT_AMBULATORY_CARE_PROVIDER_SITE_OTHER): Payer: 59 | Admitting: Cardiology

## 2023-06-09 DIAGNOSIS — N390 Urinary tract infection, site not specified: Secondary | ICD-10-CM

## 2023-06-09 LAB — POCT URINALYSIS DIPSTICK
Bilirubin, UA: NEGATIVE
Blood, UA: POSITIVE
Glucose, UA: NEGATIVE
Ketones, UA: NEGATIVE
Leukocytes, UA: NEGATIVE
Nitrite, UA: NEGATIVE
Protein, UA: POSITIVE — AB
Spec Grav, UA: 1.025 (ref 1.010–1.025)
Urobilinogen, UA: 0.2 U/dL
pH, UA: 6 (ref 5.0–8.0)

## 2023-06-09 MED ORDER — NORETHINDRONE 0.35 MG PO TABS: 1.0000 | ORAL_TABLET | Freq: Every day | ORAL | 1 refills | Status: DC

## 2023-06-09 NOTE — Progress Notes (Signed)
Patient notified

## 2023-06-23 ENCOUNTER — Ambulatory Visit (INDEPENDENT_AMBULATORY_CARE_PROVIDER_SITE_OTHER): Payer: 59 | Admitting: Urology

## 2023-06-23 ENCOUNTER — Encounter: Payer: Self-pay | Admitting: Urology

## 2023-06-23 VITALS — BP 105/72 | HR 80 | Ht 62.0 in | Wt 160.0 lb

## 2023-06-23 DIAGNOSIS — N390 Urinary tract infection, site not specified: Secondary | ICD-10-CM

## 2023-06-23 DIAGNOSIS — Z87440 Personal history of urinary (tract) infections: Secondary | ICD-10-CM

## 2023-06-23 LAB — MICROSCOPIC EXAMINATION: Epithelial Cells (non renal): >10 /[HPF] — AB (ref 0–10)

## 2023-06-23 LAB — URINALYSIS, COMPLETE
Bilirubin, UA: NEGATIVE
Glucose, UA: NEGATIVE
Ketones, UA: NEGATIVE
Nitrite, UA: NEGATIVE
Protein,UA: NEGATIVE
RBC, UA: NEGATIVE
Specific Gravity, UA: 1.02 (ref 1.005–1.030)
Urobilinogen, Ur: 0.2 mg/dL (ref 0.2–1.0)
pH, UA: 7 (ref 5.0–7.5)

## 2023-06-23 MED ORDER — NITROFURANTOIN MACROCRYSTAL 50 MG PO CAPS: ORAL_CAPSULE | ORAL | 1 refills | Status: DC

## 2023-06-23 NOTE — Progress Notes (Signed)
    I, Maysun Anabel Bene, acting as a scribe for Riki Altes, MD., have documented all relevant documentation on the behalf of Riki Altes, MD, as directed by Riki Altes, MD while in the presence of Riki Altes, MD.  06/23/2023 3:47 PM   Kelly Ponce 11-01-2000 [id number]  Referring provider: Marisue Ivan, NP [address] [place],  [place] [address]  Chief Complaint  Patient presents with   Recurrent UTI    HPI: Kelly Ponce is a 22 y.o. female referred for evaluation of recurrent UTIs.   States she began to have recurrent urinary tract infections around June 2024. Her symptoms include increased odor to her urine and flank pain No dysuria, frequency, or urgency.  Presently asymptomatic  Chart review with insignificant growth urine cultures in late October 2024 and early November 2024 She had urine cultures positive for E. coli in September 2024 and early October 2024.  No febrile UTIs or pyelonephritis.  Denies gross hematuria.  She does occasionally note symptoms within 24 hours after intercourse.   PMH: Past Medical History:  Diagnosis Date   Medical history non-contributory     Surgical History: Past Surgical History:  Procedure Laterality Date   NO PAST SURGERIES      Home Medications:  Allergies as of 06/23/2023   No Known Allergies      Medication List        Accurate as of June 23, 2023  3:47 PM. If you have any questions, ask your nurse or doctor.          nitrofurantoin 50 MG capsule Commonly known as: MACRODANTIN 1 capsule p.o. after intercourse Started by: Riki Altes   norethindrone 0.35 MG tablet Commonly known as: Camila Take 1 tablet (0.35 mg total) by mouth daily.        Allergies: No Known Allergies  Social History:  reports that she has never smoked. She has never used smokeless tobacco. She reports current alcohol use of about 4.0 standard drinks of alcohol per week. She reports current drug use. Drug:  Marijuana.   Physical Exam: BP 105/72   Pulse 80   Ht 5\' 2"  (1.575 m)   Wt 160 lb (72.6 kg)   BMI 29.26 kg/m   Constitutional:  Alert and oriented, No acute distress. HEENT: [place] AT Respiratory: Normal respiratory effort, no increased work of breathing. Psychiatric: Normal mood and affect.  Urinalysis Microscopy 11-30 WBC however >10 epis    Assessment & Plan:    1. Recurrent UTI She does meet the AUA criteria for diagnosis of recurrent UTI in women. UA today with pyuria however she is asymptomatic and significant epis indicate vaginal contamination Since she has flank pain with symptoms as one of her symptoms, will schedule a screening renal ultrasound.  Start low-dose antibiotic post-coital prophylaxis with nitrofurantoin 50 mg after intercourse.  PA follow-up 3 months for a recheck and instructed call earlier for UTI symptoms.   I have reviewed the above documentation for accuracy and completeness, and I agree with the above.   Riki Altes, MD  [facility] [address] [place], [place] [address] [phone]

## 2023-06-23 NOTE — Patient Instructions (Signed)
Cranberry and D mannose

## 2023-06-25 ENCOUNTER — Encounter: Payer: Self-pay | Admitting: Urology

## 2023-07-12 ENCOUNTER — Ambulatory Visit
Admission: RE | Admit: 2023-07-12 | Discharge: 2023-07-12 | Disposition: A | Discharge status: Home / Self Care | Payer: 59 | Source: Ambulatory Visit | Attending: Urology | Admitting: Urology

## 2023-07-12 DIAGNOSIS — N390 Urinary tract infection, site not specified: Secondary | ICD-10-CM | POA: Insufficient documentation

## 2023-09-21 ENCOUNTER — Encounter: Payer: Self-pay | Admitting: Physician Assistant

## 2023-09-21 ENCOUNTER — Ambulatory Visit (INDEPENDENT_AMBULATORY_CARE_PROVIDER_SITE_OTHER): Payer: 59 | Admitting: Physician Assistant

## 2023-09-21 VITALS — BP 105/70 | HR 99 | Ht 62.0 in | Wt 160.0 lb

## 2023-09-21 DIAGNOSIS — N390 Urinary tract infection, site not specified: Secondary | ICD-10-CM

## 2023-09-21 LAB — URINALYSIS, COMPLETE
Bilirubin, UA: NEGATIVE
Glucose, UA: NEGATIVE
Ketones, UA: NEGATIVE
Leukocytes,UA: NEGATIVE
Nitrite, UA: NEGATIVE
Specific Gravity, UA: 1.02 (ref 1.005–1.030)
Urobilinogen, Ur: 1 mg/dL (ref 0.2–1.0)
pH, UA: 8.5 — ABNORMAL HIGH (ref 5.0–7.5)

## 2023-09-21 LAB — MICROSCOPIC EXAMINATION

## 2023-09-21 MED ORDER — NITROFURANTOIN MACROCRYSTAL 50 MG PO CAPS: ORAL_CAPSULE | ORAL | 10 refills | Status: DC

## 2023-09-21 NOTE — Progress Notes (Signed)
 09/21/2023 2:08 PM   Kelly Ponce 2001/02/02 [id number]  CC: Chief Complaint  Patient presents with   Recurrent UTI   HPI: Kelly Ponce is a 23 y.o. female with PMH recurrent UTI on postcoital prophylaxis who presents today for follow-up.  She is accompanied today by her mother.  Today she reports she ran out of Macrobid about 2 months ago.  No UTIs since her last office visit about 3 months ago.  She is menstruating today.  In-office UA today positive for 3+ blood and trace protein; urine microscopy with 3-10 RBCs/HPF and moderate bacteria.   PMH: Past Medical History:  Diagnosis Date   Medical history non-contributory     Surgical History: Past Surgical History:  Procedure Laterality Date   NO PAST SURGERIES      Home Medications:  Allergies as of 09/21/2023   No Known Allergies      Medication List        Accurate as of September 21, 2023  2:08 PM. If you have any questions, ask your nurse or doctor.          nitrofurantoin 50 MG capsule Commonly known as: MACRODANTIN 1 capsule p.o. after intercourse   norethindrone 0.35 MG tablet Commonly known as: Camila Take 1 tablet (0.35 mg total) by mouth daily.        Allergies:  No Known Allergies  Family History: No family history on file.  Social History:   reports that she has never smoked. She has never used smokeless tobacco. She reports current alcohol use of about 4.0 standard drinks of alcohol per week. She reports current drug use. Drug: Marijuana.  Physical Exam: BP 105/70   Pulse 99   Ht 5\' 2"  (1.575 m)   Wt 160 lb (72.6 kg)   BMI 29.26 kg/m   Constitutional:  Alert and oriented, no acute distress, nontoxic appearing HEENT: [place], AT Cardiovascular: No clubbing, cyanosis, or edema Respiratory: Normal respiratory effort, no increased work of breathing Skin: No rashes, bruises or suspicious lesions Neurologic: Grossly intact, no focal deficits, moving all 4 extremities Psychiatric: Normal  mood and affect  Laboratory Data: Results for orders placed or performed in visit on 09/21/23  Microscopic Examination   Collection Time: 09/21/23  1:33 PM   Urine  Result Value Ref Range   WBC, UA 0-5 0 - 5 /hpf   RBC, Urine 3-10 (A) 0 - 2 /hpf   Epithelial Cells (non renal) 0-10 0 - 10 /hpf   Casts Present (A) None seen /lpf   Cast Type Granular casts (A) N/A   Bacteria, UA Moderate (A) None seen/Few  Urinalysis, Complete   Collection Time: 09/21/23  1:33 PM  Result Value Ref Range   Specific Gravity, UA 1.020 1.005 - 1.030   pH, UA 8.5 (H) 5.0 - 7.5   Color, UA Yellow Yellow   Appearance Ur Hazy (A) Clear   Leukocytes,UA Negative Negative   Protein,UA Trace (A) Negative/Trace   Glucose, UA Negative Negative   Ketones, UA Negative Negative   RBC, UA 3+ (A) Negative   Bilirubin, UA Negative Negative   Urobilinogen, Ur 1.0 0.2 - 1.0 mg/dL   Nitrite, UA Negative Negative   Microscopic Examination See below:    Assessment & Plan:   1. Recurrent UTI (Primary) Doing well on postcoital prophylaxis, though she ran out of this.  Microscopic hematuria is likely contaminant in the setting of menstruation.  Will refill postcoital Macrobid and have her follow-up in a  year.  Can consider switching her to Keflex in the future if desired in the setting of pregnancy. - Urinalysis, Complete - nitrofurantoin (MACRODANTIN) 50 MG capsule; 1 capsule p.o. after intercourse  Dispense: 30 capsule; Refill: 10  Return in about 1 year (around 09/20/2024) for Annual rUTI follow up.  Carman Ching, PA-C  [facility] [place] [address], [address] [place], [place] [address] [phone]

## 2023-10-29 ENCOUNTER — Encounter: Payer: Self-pay | Admitting: Physician Assistant

## 2023-10-29 ENCOUNTER — Ambulatory Visit (INDEPENDENT_AMBULATORY_CARE_PROVIDER_SITE_OTHER): Admitting: Physician Assistant

## 2023-10-29 VITALS — BP 111/77 | HR 80 | Ht 62.0 in | Wt 167.4 lb

## 2023-10-29 DIAGNOSIS — N390 Urinary tract infection, site not specified: Secondary | ICD-10-CM | POA: Diagnosis not present

## 2023-10-29 LAB — URINALYSIS, COMPLETE
Bilirubin, UA: NEGATIVE
Glucose, UA: NEGATIVE
Ketones, UA: NEGATIVE
Nitrite, UA: NEGATIVE
Protein,UA: NEGATIVE
RBC, UA: NEGATIVE
Specific Gravity, UA: >1.03 — ABNORMAL HIGH (ref 1.005–1.030)
Urobilinogen, Ur: 0.2 mg/dL (ref 0.2–1.0)
pH, UA: 5.5 (ref 5.0–7.5)

## 2023-10-29 LAB — MICROSCOPIC EXAMINATION: Epithelial Cells (non renal): >10 /HPF — AB (ref 0–10)

## 2023-10-29 MED ORDER — NITROFURANTOIN MONOHYD MACRO 100 MG PO CAPS
100.0000 mg | ORAL_CAPSULE | Freq: Two times a day (BID) | ORAL | 0 refills | Status: AC
Start: 2023-10-29 — End: 2023-11-03

## 2023-10-29 NOTE — Progress Notes (Signed)
 10/29/2023 9:58 AM   Kelly Ponce Jan 27, 2001 [id number]  CC: Chief Complaint  Patient presents with   Recurrent UTI   HPI: Kelly Ponce is a 23 y.o. female with PMH recurrent UTI previously on postcoital prophylaxis who presents today for evaluation of possible UTI.   Today she reports urinary frequency and malodorous urine x1 week in the setting of resuming sexual activity.  I previously refilled her postcoital Macrobid, but she has not taken it yet.  She denies vaginal discomfort, discharge, or odor.  In-office UA today positive for trace leukocytes; urine microscopy with 6-10 WBCs/HPF, >10 epithelial cells/hpf, and moderate bacteria.  PMH: Past Medical History:  Diagnosis Date   Medical history non-contributory     Surgical History: Past Surgical History:  Procedure Laterality Date   NO PAST SURGERIES      Home Medications:  Allergies as of 10/29/2023   No Known Allergies      Medication List        Accurate as of October 29, 2023  9:58 AM. If you have any questions, ask your nurse or doctor.          nitrofurantoin 50 MG capsule Commonly known as: MACRODANTIN 1 capsule p.o. after intercourse   norethindrone 0.35 MG tablet Commonly known as: Camila Take 1 tablet (0.35 mg total) by mouth daily.        Allergies:  No Known Allergies  Family History: History reviewed. No pertinent family history.  Social History:   reports that she has never smoked. She has never used smokeless tobacco. She reports current alcohol use of about 4.0 standard drinks of alcohol per week. She reports current drug use. Drug: Marijuana.  Physical Exam: BP 111/77   Pulse 80   Ht 5\' 2"  (1.575 m)   Wt 167 lb 6.4 oz (75.9 kg)   BMI 30.62 kg/m   Constitutional:  Alert and oriented, no acute distress, nontoxic appearing HEENT: [place], AT Cardiovascular: No clubbing, cyanosis, or edema Respiratory: Normal respiratory effort, no increased work of breathing Skin: No rashes,  bruises or suspicious lesions Neurologic: Grossly intact, no focal deficits, moving all 4 extremities Psychiatric: Normal mood and affect  Laboratory Data: Results for orders placed or performed in visit on 10/29/23  Microscopic Examination   Collection Time: 10/29/23  9:38 AM   Urine  Result Value Ref Range   WBC, UA 6-10 (A) 0 - 5 /hpf   RBC, Urine 0-2 0 - 2 /hpf   Epithelial Cells (non renal) >10 (A) 0 - 10 /hpf   Mucus, UA Present (A) Not Estab.   Bacteria, UA Moderate (A) None seen/Few  Urinalysis, Complete   Collection Time: 10/29/23  9:38 AM  Result Value Ref Range   Specific Gravity, UA >1.030 (H) 1.005 - 1.030   pH, UA 5.5 5.0 - 7.5   Color, UA Yellow Yellow   Appearance Ur Hazy (A) Clear   Leukocytes,UA Trace (A) Negative   Protein,UA Negative Negative/Trace   Glucose, UA Negative Negative   Ketones, UA Negative Negative   RBC, UA Negative Negative   Bilirubin, UA Negative Negative   Urobilinogen, Ur 0.2 0.2 - 1.0 mg/dL   Nitrite, UA Negative Negative   Microscopic Examination See below:    Assessment & Plan:   1. Recurrent UTI (Primary) UA today mildly positive, though does appear contaminated with epithelial cells.  Will start empiric Macrobid and send for culture for further evaluation.  We discussed that if her symptoms do not  improve, I like to see her back and would pursue testing for BV. - Urinalysis, Complete - CULTURE, URINE COMPREHENSIVE - nitrofurantoin, macrocrystal-monohydrate, (MACROBID) 100 MG capsule; Take 1 capsule (100 mg total) by mouth 2 (two) times daily for 5 days.  Dispense: 10 capsule; Refill: 0   Return if symptoms worsen or fail to improve.  Carman Ching, PA-C  [facility] [place] [address], [address] [place], [place] [address] [phone]

## 2023-11-02 LAB — CULTURE, URINE COMPREHENSIVE

## 2023-11-13 ENCOUNTER — Other Ambulatory Visit: Payer: Self-pay | Admitting: Cardiology

## 2023-11-18 ENCOUNTER — Ambulatory Visit: Admitting: Physician Assistant

## 2023-11-18 VITALS — BP 103/70 | HR 70 | Ht 63.0 in | Wt 160.0 lb

## 2023-11-18 DIAGNOSIS — Z3041 Encounter for surveillance of contraceptive pills: Secondary | ICD-10-CM | POA: Diagnosis not present

## 2023-11-18 DIAGNOSIS — N390 Urinary tract infection, site not specified: Secondary | ICD-10-CM | POA: Diagnosis not present

## 2023-11-18 LAB — URINALYSIS, COMPLETE
Bilirubin, UA: NEGATIVE
Glucose, UA: NEGATIVE
Ketones, UA: NEGATIVE
Nitrite, UA: NEGATIVE
Protein,UA: NEGATIVE
Specific Gravity, UA: 1.02 (ref 1.005–1.030)
Urobilinogen, Ur: 1 mg/dL (ref 0.2–1.0)
pH, UA: 6.5 (ref 5.0–7.5)

## 2023-11-18 LAB — MICROSCOPIC EXAMINATION: WBC, UA: >30 /HPF — AB (ref 0–5)

## 2023-11-18 MED ORDER — ESTRADIOL 0.1 MG/GM VA CREA
TOPICAL_CREAM | VAGINAL | 1 refills | Status: AC
Start: 2023-11-18 — End: ?

## 2023-11-18 MED ORDER — NORETHINDRONE 0.35 MG PO TABS: 1.0000 | ORAL_TABLET | Freq: Every day | ORAL | 0 refills | Status: AC

## 2023-11-18 MED ORDER — NITROFURANTOIN MONOHYD MACRO 100 MG PO CAPS: 100.0000 mg | ORAL_CAPSULE | Freq: Two times a day (BID) | ORAL | 0 refills | Status: AC

## 2023-11-18 MED ORDER — NORETHINDRONE 0.35 MG PO TABS
1.0000 | ORAL_TABLET | Freq: Every day | ORAL | 0 refills | Status: DC
Start: 2023-11-18 — End: 2023-11-18

## 2023-11-18 NOTE — Progress Notes (Signed)
 11/18/2023 11:39 AM   Daneil Dunker Feb 04, 2001 [id number]  CC: Chief Complaint  Patient presents with   Recurrent UTI   HPI: Kelly Ponce is a 23 y.o. female with PMH recurrent UTI on postcoital Macrobid  who presents today for valuation of possible UTI.   Saw her in clinic most recently on 10/29/2023 for the same.  She has been unable to take postcoital Macrobid  at the time.  I put her on Macrobid  100 mg twice daily x 5 days and urine culture grew pansensitive Citrobacter koseri.  Today she reports her symptoms completely resolved on Macrobid , but returned about 2 days ago.  She describes dysuria and malodorous urine.  She thinks this was associated with sexual activity 2 days ago, however she was able to take postcoital Macrobid  at that time.  Notably, her period ended yesterday.  She is currently on hormonal contraception with norethindrone  and requests a refill.  She has been on this for about a year, prior to which she was pregnant and lactating.  In-office UA today positive for 1+ blood and 1+ leukocytes; urine microscopy with >30 WBCs/HPF, 3-10 RBCs/HPF, and moderate bacteria.  PMH: Past Medical History:  Diagnosis Date   Medical history non-contributory     Surgical History: Past Surgical History:  Procedure Laterality Date   NO PAST SURGERIES      Home Medications:  Allergies as of 11/18/2023   No Known Allergies      Medication List        Accurate as of Nov 18, 2023 11:39 AM. If you have any questions, ask your nurse or doctor.          estradiol  0.1 MG/GM vaginal cream Commonly known as: ESTRACE  Apply one pea-sized amount around the opening of the urethra daily for 2 weeks, then 3 times weekly for 4 weeks.   nitrofurantoin  (macrocrystal-monohydrate) 100 MG capsule Commonly known as: MACROBID  Take 1 capsule (100 mg total) by mouth 2 (two) times daily for 5 days.   nitrofurantoin  50 MG capsule Commonly known as: MACRODANTIN  1 capsule p.o. after  intercourse   norethindrone  0.35 MG tablet Commonly known as: MICRONOR  Take 1 tablet (0.35 mg total) by mouth daily.        Allergies:  No Known Allergies  Family History: No family history on file.  Social History:   reports that she has never smoked. She has never used smokeless tobacco. She reports current alcohol use of about 4.0 standard drinks of alcohol per week. She reports current drug use. Drug: Marijuana.  Physical Exam: BP 103/70   Pulse 70   Ht 5\' 3"  (1.6 m)   Wt 160 lb (72.6 kg)   BMI 28.34 kg/m   Constitutional:  Alert and oriented, no acute distress, nontoxic appearing HEENT: [place], AT Cardiovascular: No clubbing, cyanosis, or edema Respiratory: Normal respiratory effort, no increased work of breathing Skin: No rashes, bruises or suspicious lesions Neurologic: Grossly intact, no focal deficits, moving all 4 extremities Psychiatric: Normal mood and affect  Laboratory Data: Results for orders placed or performed in visit on 11/18/23  Microscopic Examination   Collection Time: 11/18/23 11:04 AM   Urine  Result Value Ref Range   WBC, UA >30 (A) 0 - 5 /hpf   RBC, Urine 3-10 (A) 0 - 2 /hpf   Epithelial Cells (non renal) 0-10 0 - 10 /hpf   Mucus, UA Present (A) Not Estab.   Bacteria, UA Moderate (A) None seen/Few  Urinalysis, Complete   Collection  Time: 11/18/23 11:04 AM  Result Value Ref Range   Specific Gravity, UA 1.020 1.005 - 1.030   pH, UA 6.5 5.0 - 7.5   Color, UA Yellow Yellow   Appearance Ur Clear Clear   Leukocytes,UA 1+ (A) Negative   Protein,UA Negative Negative/Trace   Glucose, UA Negative Negative   Ketones, UA Negative Negative   RBC, UA 1+ (A) Negative   Bilirubin, UA Negative Negative   Urobilinogen, Ur 1.0 0.2 - 1.0 mg/dL   Nitrite, UA Negative Negative   Microscopic Examination See below:    Assessment & Plan:   1. Recurrent UTI (Primary) UA again appears grossly infected, will start empiric Macrobid  and send for culture for  further evaluation.  She is having frequent UTIs despite postcoital prophylaxis.  I recommended increasing to daily prophylaxis x 90 days, will prescribe based on culture results.  She is in agreement.  With current hormonal contraceptive use and recent pregnancy and breast-feeding, I question possible related genitourinary syndrome as contributory.  We decided to pursue 6 weeks of topical vaginal estrogen cream in addition to suppression as above. - Urinalysis, Complete - CULTURE, URINE COMPREHENSIVE - nitrofurantoin , macrocrystal-monohydrate, (MACROBID ) 100 MG capsule; Take 1 capsule (100 mg total) by mouth 2 (two) times daily for 5 days.  Dispense: 10 capsule; Refill: 0 - estradiol  (ESTRACE ) 0.1 MG/GM vaginal cream; Apply one pea-sized amount around the opening of the urethra daily for 2 weeks, then 3 times weekly for 4 weeks.  Dispense: 42.5 g; Refill: 1  2. Encounter for surveillance of contraceptive pills Will refill birth control x 1 month, but I encouraged her to follow-up with PCP or GYN for further refills. - norethindrone  (MICRONOR ) 0.35 MG tablet; Take 1 tablet (0.35 mg total) by mouth daily.  Dispense: 30 tablet; Refill: 0  Return for Will call to start suppressive abx per cx results.  Kathreen Pare, PA-C  [facility] [place] [address], [address] [place], [place] [address] [phone]

## 2023-11-23 LAB — CULTURE, URINE COMPREHENSIVE

## 2023-11-24 ENCOUNTER — Other Ambulatory Visit: Payer: Self-pay

## 2023-11-24 NOTE — Progress Notes (Signed)
 Sent message to confirm dosage

## 2023-11-25 ENCOUNTER — Other Ambulatory Visit: Payer: Self-pay

## 2023-11-25 MED ORDER — NITROFURANTOIN MACROCRYSTAL 50 MG PO CAPS: 50.0000 mg | ORAL_CAPSULE | Freq: Every day | ORAL | 0 refills | Status: DC

## 2023-12-29 DIAGNOSIS — S6391XA Sprain of unspecified part of right wrist and hand, initial encounter: Secondary | ICD-10-CM | POA: Diagnosis not present

## 2023-12-29 DIAGNOSIS — M79641 Pain in right hand: Secondary | ICD-10-CM | POA: Diagnosis not present

## 2023-12-29 DIAGNOSIS — S60221A Contusion of right hand, initial encounter: Secondary | ICD-10-CM | POA: Diagnosis not present

## 2023-12-29 NOTE — Progress Notes (Signed)
 HPI  SUBJECTIVE:  History of Present Illness Kelly Ponce is a left handed 23 year old female who presents with right hand pain and swelling after punching a brick wall.  She punched a brick wall with her right hand around 11:50 AM today. Swelling is present in the entire right hand and wrist, with particular swelling along the fourth and fifth metacarpals. She experiences sharp, constant pain in the hand, which worsens with movement. All fingers are painful with range of motion, although she can move them. The thumb is less painful than the other fingers.  No numbness, tingling, or weakness in the fingers.  No bruising.  Skin intact.  There is no limitation of motion in the fingers. She attempted to alleviate the pain by placing a cold bottle of water on her hand, but it did not provide relief.  She has not tried anything else for her symptoms.  Symptoms are worse with general use, picking up heavy objects.  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 She has no past medical history.  LMP: 5/20.  Denies possibility of being pregnant.  PCP: [facility].   No past medical history on file.  History reviewed. No pertinent surgical history.  No family history on file.  Social History   Tobacco Use  . Smoking status: Never  . Smokeless tobacco: Never  Vaping Use  . Vaping status: Never Used  Substance Use Topics  . Alcohol use: Yes    Comment: socially  . Drug use: Never     Current Outpatient Medications:  .  norethindrone  (MICRONOR ) 0.35 mg tablet, Take 0.35 mg by mouth once daily, Disp: , Rfl:  .  ibuprofen  (MOTRIN ) 600 MG tablet, Take 1 tablet (600 mg total) by mouth every 6 (six) hours as needed for Pain for up to 7 days, Disp: 28 tablet, Rfl: 0  No Known Allergies   ROS  As noted in HPI.   Physical Exam  BP 100/65   Pulse 80   Temp 37.2 C (98.9 F) (Oral)   Ht 160 cm (5' 3)   Wt 78.3 kg (172 lb 9.6 oz)   LMP 12/07/2023 (Exact Date)   SpO2 98%   BMI 30.57 kg/m    Constitutional: Well developed, well nourished, no acute distress Eyes:  EOMI, conjunctiva normal bilaterally HENT: Normocephalic, atraumatic,mucus membranes moist Respiratory: Normal inspiratory effort Cardiovascular: Normal rate GI: nondistended skin: No rash, skin intact Musculoskeletal: Right hand: Baseline Strength and Sensation with normal light touch intact for Pt, distal motor and sensation intact in median/radial/ulnar nerve distribution with RP 2+.  Hand with intact motor strength 5/5 flexion / extension / FDP  / FDS against resistance and 2-point discrimination intact at 5mm in all digit(s). Skin intact.  Soft tissue swelling lateral hand.  Tenderness along the fifth metacarpal, MCP joint, little finger.  No other tenderness over the rest of the hand or wrist.  No limitation of motion of the fingers or wrist.  Wrist WNL.  Neurologic: Alert & oriented x 3, no focal neuro deficits Psychiatric: Speech and behavior appropriate   Walk-In Course   Administrations This Visit     acetaminophen  (TYLENOL ) tablet 1,000 mg     Admin Date 12/29/2023 Action Given Dose 1,000 mg Route Oral Documented By Merritt Browning, CMA         ibuprofen  (MOTRIN ) tablet 600 mg     Admin Date 12/29/2023 Action Given Dose 600 mg Route Oral Documented By Merritt Browning, CMA  Orders Placed This Encounter  Procedures  . X-ray hand right minimum 3 views    This procedure is enabled for patient scheduling in MyChart. Please indicate if the patient should not be able to schedule an appointment for this procedure in MyChart.:   Allow MyChart Scheduling    Reason for Exam: (Free Text):   Punched a wall.  Tenderness along fifth metacarpal, little finger.  Lateral soft tissue swelling.  Skin intact no bruising.    Is the patient pregnant?:   No    Release to patient:   Immediate    No results found for this visit on 12/29/23.   Walk-In Clinical Impression  1. Sprain, hand,  right, initial encounter   2. Right hand pain   3. Contusion of right hand, initial encounter      Walk-In Assessment/Plan  Patient presents with right hand pain after punching a brick wall.  X-ray hand to rule out boxer's fracture.  Giving 600 mg ibuprofen , 1000 mg of Tylenol  here.  [place] Narcotic database reviewed for this patient, and feel that the risk/benefit ratio today is favorable for proceeding with a prescription for controlled substance.  Reviewed imaging independently.  No displaced fracture, dislocation as read by me. Formal radiology report pending at this time. Discussed this with patient. Will contact patient if over read changes management.   Patient presents with a contusion and sprain to her right hand.  Home with Tylenol /ibuprofen , ice to the area.  Offered to splint the hand for comfort, but patient has opted for an Ace wrap only.  Work note for 3 days follow-up with [facility] as needed.  Discussed  imaging, MDM, treatment plan, and plan for follow-up with patient. patient agrees with plan.   Requested Prescriptions   Signed Prescriptions Disp Refills  . ibuprofen  (MOTRIN ) 600 MG tablet 28 tablet 0    Sig: Take 1 tablet (600 mg total) by mouth every 6 (six) hours as needed for Pain for up to 7 days      *This clinic note was created using Scientist, clinical (histocompatibility and immunogenetics). Therefore, there may be occasional mistakes despite careful proofreading.  @ESIG @  Rosina Shirts, M.D.

## 2024-01-03 NOTE — Result Encounter Note (Signed)
 Hi there- this patient has a nondisplaced hamate fracture which I did not appreciate on my review. This needs to be treated with a cast or splint for 6 weeks. She works with her hands as a Copy. Can you please contact the patient, let her know that she has a hand fracture that needs evaluation and casting? Since we don't have hand at the [facility] clinic, I'd advise follow up with [facility] asap. They have a walk in clinic that needs no appointment, so she can go there today. Thank you Dr Van

## 2024-02-24 ENCOUNTER — Ambulatory Visit: Admitting: Physician Assistant

## 2024-02-29 ENCOUNTER — Ambulatory Visit (INDEPENDENT_AMBULATORY_CARE_PROVIDER_SITE_OTHER): Admitting: Physician Assistant

## 2024-02-29 VITALS — BP 123/74 | HR 79 | Ht 62.0 in | Wt 167.8 lb

## 2024-02-29 DIAGNOSIS — N390 Urinary tract infection, site not specified: Secondary | ICD-10-CM

## 2024-02-29 NOTE — Progress Notes (Signed)
   02/29/2024 11:58 AM   Narvis LOISE Leaf Jun 12, 2001 [id number]  CC: Chief Complaint  Patient presents with   Recurrent UTI   HPI: Kelly Ponce is a 23 y.o. female with PMH recurrent UTI who presents today for follow-up.   Today she reports she was unable to fill her estrogen cream and daily suppressive Macrobid  because her Medicaid was discontinued.  She has been infection free since I last saw her, which she attributes to not being sexually active, and has no acute concerns today.  PMH: Past Medical History:  Diagnosis Date   Medical history non-contributory     Surgical History: Past Surgical History:  Procedure Laterality Date   NO PAST SURGERIES      Home Medications:  Allergies as of 02/29/2024   No Known Allergies      Medication List        Accurate as of February 29, 2024 11:58 AM. If you have any questions, ask your nurse or doctor.          estradiol  0.1 MG/GM vaginal cream Commonly known as: ESTRACE  Apply one pea-sized amount around the opening of the urethra daily for 2 weeks, then 3 times weekly for 4 weeks.   nitrofurantoin  50 MG capsule Commonly known as: MACRODANTIN  1 capsule p.o. after intercourse   nitrofurantoin  50 MG capsule Commonly known as: Macrodantin  Take 1 capsule (50 mg total) by mouth daily.   norethindrone  0.35 MG tablet Commonly known as: MICRONOR  Take 1 tablet (0.35 mg total) by mouth daily.        Allergies:  No Known Allergies  Family History: No family history on file.  Social History:   reports that she has never smoked. She has never used smokeless tobacco. She reports current alcohol use of about 4.0 standard drinks of alcohol per week. She reports current drug use. Drug: Marijuana.  Physical Exam: BP 123/74 (BP Location: Left Arm, Patient Position: Sitting, Cuff Size: Normal)   Pulse 79   Ht 5' 2 (1.575 m)   Wt 167 lb 12.8 oz (76.1 kg)   BMI 30.69 kg/m   Constitutional:  Alert and oriented, no acute  distress, nontoxic appearing HEENT: [place], AT Cardiovascular: No clubbing, cyanosis, or edema Respiratory: Normal respiratory effort, no increased work of breathing Skin: No rashes, bruises or suspicious lesions Neurologic: Grossly intact, no focal deficits, moving all 4 extremities Psychiatric: Normal mood and affect  Assessment & Plan:   1. Recurrent UTI (Primary) Infection free x 3 months, not currently sexually active.  Cost concerns with suppressive therapy and estrogen cream since her Medicaid was discontinued.  She will follow-up with me as needed.  Return if symptoms worsen or fail to improve.  Lucie Hones, PA-C  [facility] [place] [address] [place], [place] [address] [phone]

## 2024-03-28 ENCOUNTER — Ambulatory Visit
Admission: EM | Admit: 2024-03-28 | Discharge: 2024-03-28 | Disposition: A | Discharge status: Home / Self Care | Attending: Emergency Medicine | Admitting: Emergency Medicine

## 2024-03-28 DIAGNOSIS — Z20822 Contact with and (suspected) exposure to covid-19: Secondary | ICD-10-CM | POA: Insufficient documentation

## 2024-03-28 DIAGNOSIS — R0789 Other chest pain: Secondary | ICD-10-CM | POA: Insufficient documentation

## 2024-03-28 DIAGNOSIS — J069 Acute upper respiratory infection, unspecified: Secondary | ICD-10-CM | POA: Diagnosis not present

## 2024-03-28 LAB — GROUP A STREP BY PCR: Group A Strep by PCR: NOT DETECTED

## 2024-03-28 LAB — SARS CORONAVIRUS 2 BY RT PCR: SARS Coronavirus 2 by RT PCR: NEGATIVE

## 2024-03-28 MED ORDER — ONDANSETRON 8 MG PO TBDP: ORAL_TABLET | ORAL | 0 refills | Status: DC

## 2024-03-28 MED ORDER — IBUPROFEN 600 MG PO TABS: 600.0000 mg | ORAL_TABLET | Freq: Four times a day (QID) | ORAL | 0 refills | Status: AC | PRN

## 2024-03-28 MED ORDER — FLUTICASONE PROPIONATE 50 MCG/ACT NA SUSP: 2.0000 | Freq: Every day | NASAL | 0 refills | Status: AC

## 2024-03-28 MED ORDER — PROMETHAZINE-DM 6.25-15 MG/5ML PO SYRP: 5.0000 mL | ORAL_SOLUTION | Freq: Four times a day (QID) | ORAL | 0 refills | Status: DC | PRN

## 2024-03-28 NOTE — ED Provider Notes (Signed)
 HPI  SUBJECTIVE:  Kelly Ponce is a 23 y.o. female who presents with 3 days of headaches, sore throat, nasal congestion, yellow rhinorrhea, sinus pain and pressure, postnasal drip, dry cough and nausea.  She reports intermittent, hours long, nonmigratory, nonradiating epigastric abdominal pain.  No fevers, bodyaches, facial swelling, upper dental pain, wheezing or shortness of breath, vomiting, diarrhea.  No abdominal distention.  No known COVID, flu, strep exposure.  She did not get the COVID-vaccine.  She is able to sleep throughout the night without waking up coughing.  She has tried Mucinex sinus with improvement in her nasal congestion.  Symptoms are worse at night.  No antibiotics in the past month.  She took Tylenol  within the past 6 hours.   She reports bilateral sharp chest pains alternating each side for the past week that lasts hours that is worse with arm movements, torso rotation.  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  No accompanying nausea, diaphoresis, radiation of this pain up her neck, down her arm or through to her back.  No exertional positional component.  No calf pain, swelling, hemoptysis, surgery in the past 4 weeks, recent immobilization, exogenous estrogen.  She is on progestin.  No history of asthma, diabetes hypertension, MI, coronary disease, CVA, PAD/PVD, smoking, PE, DVT.  Family history negative for early MI.  She has had similar symptoms like this before 1 year ago and had a normal EKG.  Does not remember the exact diagnosis.  LMP: 2 weeks ago.  Denies possibility of being pregnant.  PCP: [facility]   Past Medical History:  Diagnosis Date   Medical history non-contributory     Past Surgical History:  Procedure Laterality Date   NO PAST SURGERIES      History reviewed. No pertinent family history.  Social History   Tobacco Use   Smoking status: Never   Smokeless tobacco: Never  Vaping Use   Vaping  status: Former  Substance Use Topics   Alcohol use: Yes    Alcohol/week: 4.0 standard drinks of alcohol    Types: 4 Cans of beer per week   Drug use: Yes    Types: Marijuana    No current facility-administered medications for this encounter.  Current Outpatient Medications:    fluticasone  (FLONASE ) 50 MCG/ACT nasal spray, Place 2 sprays into both nostrils daily., Disp: 16 g, Rfl: 0   ibuprofen  (ADVIL ) 600 MG tablet, Take 1 tablet (600 mg total) by mouth every 6 (six) hours as needed., Disp: 30 tablet, Rfl: 0   ondansetron  (ZOFRAN -ODT) 8 MG disintegrating tablet, 1/2- 1 tablet q 8 hr prn nausea, vomiting, Disp: 20 tablet, Rfl: 0   promethazine -dextromethorphan (PROMETHAZINE -DM) 6.25-15 MG/5ML syrup, Take 5 mLs by mouth 4 (four) times daily as needed for cough., Disp: 118 mL, Rfl: 0   estradiol  (ESTRACE ) 0.1 MG/GM vaginal cream, Apply one pea-sized amount around the opening of the urethra daily for 2 weeks, then 3 times weekly for 4 weeks., Disp: 42.5 g, Rfl: 1   nitrofurantoin  (MACRODANTIN ) 50 MG capsule, 1 capsule p.o. after intercourse, Disp: 30 capsule, Rfl: 10   nitrofurantoin  (MACRODANTIN ) 50 MG capsule, Take 1 capsule (50 mg total) by mouth daily., Disp: 90 capsule, Rfl: 0   norethindrone  (MICRONOR ) 0.35 MG tablet, Take 1 tablet (0.35 mg total) by mouth daily., Disp: 30 tablet, Rfl: 0  No Known Allergies   ROS  As noted in HPI.   Physical Exam  BP 108/72 (BP Location: Left Arm)   Pulse 77   Temp 98.4 F (36.9 C) (Oral)   Resp 18   Wt 76.2 kg   LMP 03/13/2024 (Exact Date)   SpO2 100%   Breastfeeding No   BMI 30.73 kg/m   Constitutional: Well developed, well nourished, no acute distress Eyes: PERRL, EOMI, conjunctiva normal bilaterally HENT: Normocephalic, atraumatic,mucus membranes moist.  Positive nasal congestion.  Erythematous, swollen turbinates.  No maxillary, frontal sinus tenderness.  Slightly erythematous oropharynx.  Tonsils normal size without exudate.  Uvula  midline.  Positive postnasal drip. Neck: No cervical lymphadenopathy. Respiratory: Clear to auscultation bilaterally, no rales, no wheezing, no rhonchi.  Positive reproducible bilateral chest wall tenderness Cardiovascular: Normal rate and rhythm, no murmurs, no gallops, no rubs GI: nondistended soft,  mild epigastric tenderness, no guarding, rebound.  Active bowel sounds. skin: No rash, skin intact Musculoskeletal: Calves symmetric, nontender, no edema. Neurologic: Alert & oriented x 3, CN III-XII grossly intact, no motor deficits, sensation grossly intact Psychiatric: Speech and behavior appropriate   ED Course   Medications - No data to display  Orders Placed This Encounter  Procedures   Group A Strep by PCR    Standing Status:   Standing    Number of Occurrences:   1   SARS Coronavirus 2 by RT PCR (hospital order, performed in [facility] hospital lab) *cepheid single result test* Anterior Nasal Swab    Standing Status:   Standing    Number of Occurrences:   1   ED EKG    Standing Status:   Standing    Number of Occurrences:   1    Reason for Exam:   Chest Pain   Results for orders placed or performed during the hospital encounter of 03/28/24 (from the past 24 hours)  Group A Strep by PCR     Status: None   Collection Time: 03/28/24 12:45 PM   Specimen: Throat; Sterile Swab  Result Value Ref Range   Group A Strep by PCR NOT DETECTED NOT DETECTED  SARS Coronavirus 2 by RT PCR (hospital order, performed in [facility] hospital lab) *cepheid single result test* Anterior Nasal Swab     Status: None   Collection Time: 03/28/24 12:45 PM   Specimen: Anterior Nasal Swab  Result Value Ref Range   SARS Coronavirus 2 by RT PCR NEGATIVE NEGATIVE   No results found.  ED Clinical Impression  1. Upper respiratory tract infection, unspecified type   2. Lab test negative for COVID-19 virus   3. Musculoskeletal chest pain      ED Assessment/Plan     1.  URI.  COVID, strep  negative.  While she has some epigastric tenderness, abdomen is benign.  Discussed results with parent and patient while in department.  No indications for antibiotics at this time.  Doubt pneumonia, deferring chest x-ray.  Home with Mucinex D, Tylenol /ibuprofen , Flonase , saline nasal irrigation, Zofran  and Promethazine  DM.  Work note for several days.  2.  Chest pain.  Suspect musculoskeletal chest pain as she is now working a new job that does a lot of repetitive upper body movements for 12 hours at a time.  She has reproducible chest pain.  However will check EKG.  EKG: Normal sinus rhythm, rate 68.  Normal axis, normal intervals.  No hypertrophy.  No ST-T wave changes.  No previous EKG for comparison.  Patient is PERC negative.  Doubt PE. HEART score:   History: Slightly suspicious 0 EKG:  Normal 0 Age: Below 45 0 Risk factors: None 0 Troponin: Not available  Total: 0.  Patient is at low risk for 30-day MACE.  EKG normal.  Presentation consistent with musculoskeletal chest pain/costochondritis.  Tylenol /ibuprofen .  Discussed labs, MDM, treatment plan, and plan for follow-up with patient. patient agrees with plan.   Meds ordered this encounter  Medications   fluticasone  (FLONASE ) 50 MCG/ACT nasal spray    Sig: Place 2 sprays into both nostrils daily.    Dispense:  16 g    Refill:  0   ibuprofen  (ADVIL ) 600 MG tablet    Sig: Take 1 tablet (600 mg total) by mouth every 6 (six) hours as needed.    Dispense:  30 tablet    Refill:  0   promethazine -dextromethorphan (PROMETHAZINE -DM) 6.25-15 MG/5ML syrup    Sig: Take 5 mLs by mouth 4 (four) times daily as needed for cough.    Dispense:  118 mL    Refill:  0   ondansetron  (ZOFRAN -ODT) 8 MG disintegrating tablet    Sig: 1/2- 1 tablet q 8 hr prn nausea, vomiting    Dispense:  20 tablet    Refill:  0      *This clinic note was created using Scientist, clinical (histocompatibility and immunogenetics). Therefore, there may be occasional mistakes despite careful  proofreading. ?    Van Knee, MD 03/28/24 1356

## 2024-03-28 NOTE — Discharge Instructions (Signed)
 Your COVID and strep PCR testing are negative. Start Mucinex-D to keep the mucous thin and to decongest you.   You may take 600 mg of motrin  with 1000 mg of tylenol  up to 3-4 times a day as needed for pain. This is an effective combination for pain.  Most sinus infections are viral and do not need antibiotics unless you have a high fever, have had this for 10 days, or you get better and then get sick again. Use a NeilMed sinus rinse with distilled water as often as you want to to reduce nasal congestion. Follow the directions on the box.  Flonase  will help with nasal congestion and postnasal drip.  Promethazine  DM for cough.  Zofran  for nausea.  Make sure you drink extra fluids.  Your EKG is normal.  I suspect that your chest pain is musculoskeletal.  Tylenol  combined with ibuprofen  should help in addition to some rest.  Go to www.goodrx.com to look up your medications. This will give you a list of where you can find your prescriptions at the most affordable prices. Or you can ask the pharmacist what the cash price is. This is frequently cheaper than going through insurance.

## 2024-03-28 NOTE — ED Triage Notes (Signed)
 Sx x 3 days  Sneezing Nasal congestion Abdominal pain Nausea  Sore throat Headache

## 2024-08-18 ENCOUNTER — Ambulatory Visit: Admitting: Cardiology

## 2024-08-18 ENCOUNTER — Encounter: Payer: Self-pay | Admitting: Cardiology

## 2024-08-18 VITALS — BP 124/68 | HR 76 | Ht 62.0 in | Wt 175.0 lb

## 2024-08-18 DIAGNOSIS — M79672 Pain in left foot: Secondary | ICD-10-CM | POA: Diagnosis not present

## 2024-08-18 DIAGNOSIS — M21612 Bunion of left foot: Secondary | ICD-10-CM | POA: Insufficient documentation

## 2024-08-18 DIAGNOSIS — Z0130 Encounter for examination of blood pressure without abnormal findings: Secondary | ICD-10-CM

## 2024-08-18 DIAGNOSIS — F332 Major depressive disorder, recurrent severe without psychotic features: Secondary | ICD-10-CM

## 2024-08-18 NOTE — Progress Notes (Signed)
 "  Established Patient Office Visit  Subjective:  Patient ID: Kelly Ponce, female    DOB: 2001-07-01  Age: 24 y.o. MRN: [id number]  Chief Complaint  Patient presents with   Acute Visit    Possible left toe fungus, podiatry referral    Patient in office for an acute visit. Patient went to [facility] on 07/29/2024 complaining of left great toe pain that has been progressive for the last 4 months. She presented to [facility] because it got noticeably worse over the prior couple days. 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 Xray of toes at [facility] unremarkable. Patient was given a prescription for ibuprofen  600 mg and told to follow up with PCP.  Patient comes in today, continues to have left toe pain. Requesting a referral to podiatry. Referral sent.  Blood pressure controlled today.     No other concerns at this time.   Past Medical History:  Diagnosis Date   Frequent UTI 04/27/2023   Medical history non-contributory     Past Surgical History:  Procedure Laterality Date   NO PAST SURGERIES      Social History   Socioeconomic History   Marital status: Single    Spouse name: Not on file   Number of children: Not on file   Years of education: Not on file   Highest education level: Not on file  Occupational History   Not on file  Tobacco Use   Smoking status: Never   Smokeless tobacco: Never  Vaping Use   Vaping status: Former  Substance and Sexual Activity   Alcohol use: Yes    Alcohol/week: 4.0 standard drinks of alcohol    Types: 4 Cans of beer per week   Drug use: Yes    Types: Marijuana   Sexual activity: Not Currently  Other Topics Concern   Not on file  Social History Narrative   Not on file   Social Drivers of Health   Tobacco Use: Low Risk  (07/29/2024)   Received from [facility]   Patient History    Smoking Tobacco Use: Never    Smokeless Tobacco Use: Never    Passive Exposure: Not on file   Financial Resource Strain: Low Risk  (12/29/2023)   Received from [facility]   Overall Financial Resource Strain (CARDIA)    Difficulty of Paying Living Expenses: Not hard at all  Food Insecurity: No Food Insecurity (12/29/2023)   Received from [facility]   Epic    Within the past 12 months, you worried that your food would run out before you got the money to buy more.: Never true    Within the past 12 months, the food you bought just didn't last and you didn't have money to get more.: Never true  Transportation Needs: No Transportation Needs (12/29/2023)   Received from [facility] - Transportation    In the past 12 months, has lack of transportation kept you from medical appointments or from getting medications?: No    Lack of Transportation (Non-Medical): No  Physical Activity: Not on file  Stress: Not on file  Social Connections: Not on file  Intimate Partner Violence: Not At Risk (07/27/2022)   Received from [facility]   Epic    Within the last year, have you been afraid of your partner or ex-partner?: No    Within the last year, have  you been humiliated or emotionally abused in other ways by your partner or ex-partner?: No    Within the last year, have you been kicked, hit, slapped, or otherwise physically hurt by your partner or ex-partner?: No    Within the last year, have you been raped or forced to have any kind of sexual activity by your partner or ex-partner?: No  Depression (PHQ2-9): Not on file  Alcohol Screen: Low Risk (12/08/2021)   Alcohol Screen    Last Alcohol Screening Score (AUDIT): 5  Housing: Low Risk  (12/29/2023)   Received from [facility]   Epic    In the last 12 months, was there a time when you were not able to pay the mortgage or rent on time?: No    In the past 12 months, how many times have you moved where you were living?: 0    At any time in the past 12 months, were  you homeless or living in a shelter (including now)?: No  Utilities: Not At Risk (12/29/2023)   Received from [facility]   Epic    In the past 12 months has the electric, gas, oil, or water company threatened to shut off services in your home?: No  Health Literacy: Not on file    No family history on file.  Allergies[1]  Show/hide medication list[2]  Review of Systems  Constitutional: Negative.   HENT: Negative.    Eyes: Negative.   Respiratory: Negative.  Negative for shortness of breath.   Cardiovascular: Negative.  Negative for chest pain.  Gastrointestinal: Negative.  Negative for abdominal pain, constipation and diarrhea.  Genitourinary: Negative.   Musculoskeletal:  Negative for joint pain and myalgias.  Skin: Negative.   Neurological: Negative.  Negative for dizziness and headaches.  Endo/Heme/Allergies: Negative.   All other systems reviewed and are negative.      Objective:   BP 124/68   Pulse 76   Ht 5' 2 (1.575 m)   Wt 175 lb (79.4 kg)   SpO2 99%   BMI 32.01 kg/m   Vitals:   08/18/24 1110  BP: 124/68  Pulse: 76  Height: 5' 2 (1.575 m)  Weight: 175 lb (79.4 kg)  SpO2: 99%  BMI (Calculated): 32    Physical Exam Vitals and nursing note reviewed.  Constitutional:      Appearance: Normal appearance. She is normal weight.  HENT:     Head: Normocephalic and atraumatic.     Nose: Nose normal.     Mouth/Throat:     Mouth: Mucous membranes are moist.  Eyes:     Extraocular Movements: Extraocular movements intact.     Conjunctiva/sclera: Conjunctivae normal.     Pupils: Pupils are equal, round, and reactive to light.  Cardiovascular:     Rate and Rhythm: Normal rate and regular rhythm.     Pulses: Normal pulses.     Heart sounds: Normal heart sounds.  Pulmonary:     Effort: Pulmonary effort is normal.     Breath sounds: Normal breath sounds.  Abdominal:     General: Abdomen is flat. Bowel sounds are normal.     Palpations:  Abdomen is soft.  Musculoskeletal:        General: Normal range of motion.     Cervical back: Normal range of motion.  Skin:    General: Skin is warm and dry.  Neurological:     General: No focal deficit present.     Mental Status:  She is alert and oriented to person, place, and time.  Psychiatric:        Mood and Affect: Mood normal.        Behavior: Behavior normal.        Thought Content: Thought content normal.        Judgment: Judgment normal.      No results found for any visits on 08/18/24.  No results found for this or any previous visit (from the past 2160 hours).    Assessment & Plan:  Referral sent to podiatry  Problem List Items Addressed This Visit       Musculoskeletal and Integument   Bunion of left foot     Other   Major depressive disorder, recurrent severe without psychotic features (HCC)   Left foot pain - Primary   Relevant Orders   Ambulatory referral to Podiatry    Return if symptoms worsen or fail to improve.   Total time spent: 25 minutes. This time includes review of previous notes and results and patient face to face interaction during today's visit.    Jeoffrey Pollen, NP  08/18/2024   This document may have been prepared by Dragon Voice Recognition software and as such may include unintentional dictation errors.     [1] No Known Allergies [2]  Outpatient Medications Prior to Visit  Medication Sig   estradiol  (ESTRACE ) 0.1 MG/GM vaginal cream Apply one pea-sized amount around the opening of the urethra daily for 2 weeks, then 3 times weekly for 4 weeks.   fluticasone  (FLONASE ) 50 MCG/ACT nasal spray Place 2 sprays into both nostrils daily.   ibuprofen  (ADVIL ) 600 MG tablet Take 1 tablet (600 mg total) by mouth every 6 (six) hours as needed.   norethindrone  (MICRONOR ) 0.35 MG tablet Take 1 tablet (0.35 mg total) by mouth daily.   [DISCONTINUED] nitrofurantoin  (MACRODANTIN ) 50 MG capsule 1 capsule p.o. after intercourse    [DISCONTINUED] nitrofurantoin  (MACRODANTIN ) 50 MG capsule Take 1 capsule (50 mg total) by mouth daily.   [DISCONTINUED] ondansetron  (ZOFRAN -ODT) 8 MG disintegrating tablet 1/2- 1 tablet q 8 hr prn nausea, vomiting   [DISCONTINUED] promethazine -dextromethorphan (PROMETHAZINE -DM) 6.25-15 MG/5ML syrup Take 5 mLs by mouth 4 (four) times daily as needed for cough.   No facility-administered medications prior to visit.   "

## 2024-09-20 ENCOUNTER — Ambulatory Visit: Admitting: Physician Assistant
